# Patient Record
Sex: Male | Born: 1945 | Race: White | Hispanic: No | Marital: Married | Smoking: Former smoker
Health system: Southern US, Community
[De-identification: ages and names within clinical notes are randomized; demographics above are authoritative.]

## PROBLEM LIST (undated history)

## (undated) ENCOUNTER — Emergency Department (HOSPITAL_BASED_OUTPATIENT_CLINIC_OR_DEPARTMENT_OTHER): Admission: EM | Payer: PRIVATE HEALTH INSURANCE

## (undated) DIAGNOSIS — I10 Essential (primary) hypertension: Secondary | ICD-10-CM

## (undated) DIAGNOSIS — E119 Type 2 diabetes mellitus without complications: Secondary | ICD-10-CM

## (undated) DIAGNOSIS — I509 Heart failure, unspecified: Secondary | ICD-10-CM

---

## 2017-05-16 ENCOUNTER — Ambulatory Visit: Payer: Self-pay | Admitting: Cardiovascular Disease

## 2020-06-04 ENCOUNTER — Encounter: Payer: Self-pay | Admitting: Dietician

## 2020-06-04 ENCOUNTER — Encounter: Payer: Medicare Other | Attending: Internal Medicine | Admitting: Dietician

## 2020-06-04 ENCOUNTER — Other Ambulatory Visit: Payer: Self-pay

## 2020-06-04 VITALS — Ht 71.0 in | Wt 241.2 lb

## 2020-06-04 DIAGNOSIS — E119 Type 2 diabetes mellitus without complications: Secondary | ICD-10-CM

## 2020-06-04 DIAGNOSIS — E118 Type 2 diabetes mellitus with unspecified complications: Secondary | ICD-10-CM | POA: Diagnosis present

## 2020-06-04 DIAGNOSIS — Z6834 Body mass index (BMI) 34.0-34.9, adult: Secondary | ICD-10-CM | POA: Insufficient documentation

## 2020-06-04 DIAGNOSIS — E669 Obesity, unspecified: Secondary | ICD-10-CM | POA: Diagnosis not present

## 2020-06-04 NOTE — Progress Notes (Signed)
Medical Nutrition Therapy: Visit start time: 1050  end time: 1200  Assessment:  Diagnosis: Type 2 diabetes Past medical history: hypertension Psychosocial issues/ stress concerns: none  Preferred learning method:  . Auditory . Visual   Current weight: 241.2lbs  Height: 5'11" Medications, supplements: reconciled list in medical record  Progress and evaluation:   Patient reports stable weight, is workig to lose weight. He lost over 70lbs in the past through lifestyle changes, but has gradually regained the weight, mostly since retirement and more sedentary lifestyle.  Checks BGs daily; fasting 104-124 recently; down from 150s prior to starting medication.   He is currently implementing a weight loss program, with some products, including protein drinks, provided.   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 He feels food portions including large snacks are the main issue to focus on at this point.  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  Physical activity: none currently  Dietary Intake:  Usual eating pattern includes 3 meals and 2 snacks per day. Dining out frequency: 1-2 meals per week.  Breakfast: coffee, protein powder mixed with 14oz coffee (100kcal, for weight loss) Snack: 12/9 grape nuts with wheat germ, milk Lunch: whole wheat bread (sandwich), lettuce wrap with chicken/ [place] Snack: [place] yogurt Supper: usually home grilled chicken, pork chops, fish (frozen fried) + salad + mashed/ baked potato/ rice/ quinoa + other low carb veggie Snack: ice cream or other sweet; occasionally crackers -- large portion Beverages: water, coffee in am, diluted juice (not recently), occ hot decaf herbal tea in evening  Nutrition Care Education: Topics covered:  Basic nutrition: appropriate nutrient balance discussing plate method, appropriate meal and snack schedule, general nutrition guidelines     Weight control: portion control strategies including pre-portioning snacks, using smaller containers Advanced nutrition:  food label reading Diabetes: appropriate meal and snack schedule, appropriate carb intake and balance-- advised goal of about 45g CHO with each meal; healthy carb choices, role of fiber, protein Other: overview of Mediterranean eating pattern  Nutritional Diagnosis:  [place]-2.2 Altered nutrition-related laboratory As related to Type 2 diabetes.  As evidenced by patient with recent HbA1C of 11.2%. [place]-3.3 Overweight/obesity As related to excess calories and inadequate physical activity.  As evidenced by patient with current BMI of 34, working on diet and lifestyle changes to improve blood sugars and lose weight.  Intervention:  . Instruction and discussion as noted above.  . Patient voices good understanding of basic nutrition. He has been making positive, appropriate changes and is motivated to continue. . Established goals for additional change with direction from patient. . Patient requested MNT follow-up in 6 months.  Education Materials given:  . General diet guidelines for Diabetes . Mediterranean Diet handout . Goals/ instructions   Learner/ who was taught:  . Patient   Level of understanding: Marland Kitchen Verbalizes/ demonstrates competency   Demonstrated degree of understanding via:   Teach back Learning barriers: . None   Willingness to learn/ readiness for change: . Eager, change in progress   Monitoring and Evaluation:  Dietary intake, exercise, BG control, and body weight      follow up: 12/02/20 at 9:00am

## 2020-06-04 NOTE — Patient Instructions (Signed)
   You are on the right track with healthy lifestyle changes, great job!  Continue to work on healthy food portions; pre-portion snacks by using small containers such as a cone for ice cream.   Keep emphasizing low-carb veggies and lean protein foods and control amounts of starchy foods.

## 2020-12-02 ENCOUNTER — Ambulatory Visit: Payer: Medicare Other | Admitting: Dietician

## 2021-01-20 ENCOUNTER — Ambulatory Visit: Payer: Medicare Other | Admitting: Dietician

## 2021-02-17 ENCOUNTER — Encounter: Payer: Self-pay | Admitting: Dietician

## 2021-02-17 NOTE — Progress Notes (Signed)
Have not heard back from patient to reschedule his missed appointment from 01/21/21 after rescheduling two time. Sent notification to referring provider.

## 2021-12-06 ENCOUNTER — Emergency Department: Payer: Medicare Other

## 2021-12-06 ENCOUNTER — Encounter: Payer: Self-pay | Admitting: Emergency Medicine

## 2021-12-06 ENCOUNTER — Observation Stay
Admission: EM | Admit: 2021-12-06 | Discharge: 2021-12-08 | Disposition: A | Discharge status: Home / Self Care | Payer: Medicare Other | Attending: Family Medicine | Admitting: Family Medicine

## 2021-12-06 DIAGNOSIS — I639 Cerebral infarction, unspecified: Secondary | ICD-10-CM

## 2021-12-06 DIAGNOSIS — I160 Hypertensive urgency: Secondary | ICD-10-CM | POA: Diagnosis not present

## 2021-12-06 DIAGNOSIS — R42 Dizziness and giddiness: Secondary | ICD-10-CM | POA: Diagnosis present

## 2021-12-06 DIAGNOSIS — Z79899 Other long term (current) drug therapy: Secondary | ICD-10-CM | POA: Diagnosis not present

## 2021-12-06 DIAGNOSIS — Z8673 Personal history of transient ischemic attack (TIA), and cerebral infarction without residual deficits: Secondary | ICD-10-CM | POA: Insufficient documentation

## 2021-12-06 DIAGNOSIS — I5032 Chronic diastolic (congestive) heart failure: Secondary | ICD-10-CM | POA: Insufficient documentation

## 2021-12-06 DIAGNOSIS — E119 Type 2 diabetes mellitus without complications: Secondary | ICD-10-CM | POA: Diagnosis not present

## 2021-12-06 DIAGNOSIS — I10 Essential (primary) hypertension: Secondary | ICD-10-CM

## 2021-12-06 DIAGNOSIS — Z87891 Personal history of nicotine dependence: Secondary | ICD-10-CM | POA: Diagnosis not present

## 2021-12-06 DIAGNOSIS — Z6830 Body mass index (BMI) 30.0-30.9, adult: Secondary | ICD-10-CM | POA: Diagnosis not present

## 2021-12-06 DIAGNOSIS — I110 Hypertensive heart disease with heart failure: Secondary | ICD-10-CM | POA: Diagnosis not present

## 2021-12-06 DIAGNOSIS — Z7982 Long term (current) use of aspirin: Secondary | ICD-10-CM | POA: Insufficient documentation

## 2021-12-06 DIAGNOSIS — E669 Obesity, unspecified: Secondary | ICD-10-CM | POA: Diagnosis not present

## 2021-12-06 DIAGNOSIS — I480 Paroxysmal atrial fibrillation: Secondary | ICD-10-CM | POA: Insufficient documentation

## 2021-12-06 DIAGNOSIS — Z7984 Long term (current) use of oral hypoglycemic drugs: Secondary | ICD-10-CM | POA: Diagnosis not present

## 2021-12-06 HISTORY — DX: Essential (primary) hypertension: I10

## 2021-12-06 HISTORY — DX: Type 2 diabetes mellitus without complications: E11.9

## 2021-12-06 HISTORY — DX: Heart failure, unspecified: I50.9

## 2021-12-06 LAB — CBC
HCT: 40.3 % (ref 39.0–52.0)
Hemoglobin: 13.3 g/dL (ref 13.0–17.0)
MCH: 29.4 pg (ref 26.0–34.0)
MCHC: 33 g/dL (ref 30.0–36.0)
MCV: 89 fL (ref 80.0–100.0)
Platelets: 205 10*3/uL (ref 150–400)
RBC: 4.53 MIL/uL (ref 4.22–5.81)
RDW: 13.3 % (ref 11.5–15.5)
WBC: 7.3 10*3/uL (ref 4.0–10.5)
nRBC: 0 % (ref 0.0–0.2)

## 2021-12-06 LAB — BASIC METABOLIC PANEL
Anion gap: 6 (ref 5–15)
BUN: 28 mg/dL — ABNORMAL HIGH (ref 8–23)
CO2: 27 mmol/L (ref 22–32)
Calcium: 9.4 mg/dL (ref 8.9–10.3)
Chloride: 108 mmol/L (ref 98–111)
Creatinine, Ser: 1.06 mg/dL (ref 0.61–1.24)
GFR, Estimated: >60 mL/min (ref >60)
Glucose, Bld: 150 mg/dL — ABNORMAL HIGH (ref 70–99)
Potassium: 4 mmol/L (ref 3.5–5.1)
Sodium: 141 mmol/L (ref 135–145)

## 2021-12-06 LAB — CBG MONITORING, ED: Glucose-Capillary: 162 mg/dL — ABNORMAL HIGH (ref 70–99)

## 2021-12-06 LAB — TROPONIN I (HIGH SENSITIVITY): Troponin I (High Sensitivity): 8 ng/L (ref <18)

## 2021-12-06 LAB — BRAIN NATRIURETIC PEPTIDE: B Natriuretic Peptide: 93.6 pg/mL (ref 0.0–100.0)

## 2021-12-06 MED ORDER — ONDANSETRON 4 MG PO TBDP
4.0000 mg | ORAL_TABLET | Freq: Once | ORAL | Status: AC
  Administered 2021-12-06: 4 mg via ORAL
  Filled 2021-12-06: qty 1

## 2021-12-06 MED ORDER — AMLODIPINE BESYLATE 5 MG PO TABS
5.0000 mg | ORAL_TABLET | Freq: Once | ORAL | Status: AC
  Administered 2021-12-06: 5 mg via ORAL
  Filled 2021-12-06: qty 1

## 2021-12-06 NOTE — ED Triage Notes (Signed)
Pt presents via POV with complaints of dizziness and HTN that started tonight. Per pt he has had elevated BP numbers today although he has been compliant with his meds. Denies LOC, falls, CP, or SOB.

## 2021-12-06 NOTE — ED Notes (Signed)
Pt discussed with MD stafford - see orders for intervention.

## 2021-12-07 ENCOUNTER — Emergency Department: Payer: Medicare Other

## 2021-12-07 ENCOUNTER — Other Ambulatory Visit: Payer: Self-pay

## 2021-12-07 ENCOUNTER — Other Ambulatory Visit (HOSPITAL_COMMUNITY): Payer: Self-pay

## 2021-12-07 ENCOUNTER — Observation Stay: Payer: Medicare Other

## 2021-12-07 DIAGNOSIS — R42 Dizziness and giddiness: Secondary | ICD-10-CM

## 2021-12-07 DIAGNOSIS — I160 Hypertensive urgency: Secondary | ICD-10-CM

## 2021-12-07 DIAGNOSIS — I5032 Chronic diastolic (congestive) heart failure: Secondary | ICD-10-CM

## 2021-12-07 DIAGNOSIS — E119 Type 2 diabetes mellitus without complications: Secondary | ICD-10-CM

## 2021-12-07 LAB — LIPID PANEL
Cholesterol: 205 mg/dL — ABNORMAL HIGH (ref 0–200)
HDL: 36 mg/dL — ABNORMAL LOW (ref >40)
LDL Cholesterol: 142 mg/dL — ABNORMAL HIGH (ref 0–99)
Total CHOL/HDL Ratio: 5.7 RATIO
Triglycerides: 136 mg/dL (ref <150)
VLDL: 27 mg/dL (ref 0–40)

## 2021-12-07 LAB — CREATININE, SERUM
Creatinine, Ser: 0.94 mg/dL (ref 0.61–1.24)
GFR, Estimated: >60 mL/min

## 2021-12-07 LAB — GLUCOSE, CAPILLARY
Glucose-Capillary: 189 mg/dL — ABNORMAL HIGH (ref 70–99)
Glucose-Capillary: 224 mg/dL — ABNORMAL HIGH (ref 70–99)
Glucose-Capillary: 211 mg/dL — ABNORMAL HIGH (ref 70–99)
Glucose-Capillary: 212 mg/dL — ABNORMAL HIGH (ref 70–99)

## 2021-12-07 LAB — HEPATIC FUNCTION PANEL
ALT: 22 U/L (ref 0–44)
AST: 26 U/L (ref 15–41)
Albumin: 3.9 g/dL (ref 3.5–5.0)
Alkaline Phosphatase: 53 U/L (ref 38–126)
Bilirubin, Direct: <0.1 mg/dL (ref 0.0–0.2)
Total Bilirubin: 0.7 mg/dL (ref 0.3–1.2)
Total Protein: 7.3 g/dL (ref 6.5–8.1)

## 2021-12-07 LAB — CBC
HCT: 40.6 % (ref 39.0–52.0)
Hemoglobin: 13.7 g/dL (ref 13.0–17.0)
MCH: 29.1 pg (ref 26.0–34.0)
MCHC: 33.7 g/dL (ref 30.0–36.0)
MCV: 86.2 fL (ref 80.0–100.0)
Platelets: 196 10*3/uL (ref 150–400)
RBC: 4.71 MIL/uL (ref 4.22–5.81)
RDW: 13.1 % (ref 11.5–15.5)
WBC: 9.5 10*3/uL (ref 4.0–10.5)
nRBC: 0 % (ref 0.0–0.2)

## 2021-12-07 LAB — HEMOGLOBIN A1C
Hgb A1c MFr Bld: 6.8 % — ABNORMAL HIGH (ref 4.8–5.6)
Mean Plasma Glucose: 148.46 mg/dL

## 2021-12-07 LAB — TROPONIN I (HIGH SENSITIVITY): Troponin I (High Sensitivity): 10 ng/L (ref <18)

## 2021-12-07 LAB — CBG MONITORING, ED: Glucose-Capillary: 188 mg/dL — ABNORMAL HIGH (ref 70–99)

## 2021-12-07 LAB — PROTIME-INR
INR: 1.1 (ref 0.8–1.2)
Prothrombin Time: 13.8 seconds (ref 11.4–15.2)

## 2021-12-07 MED ORDER — LORAZEPAM 2 MG/ML IJ SOLN
1.0000 mg | Freq: Once | INTRAMUSCULAR | Status: AC | PRN
  Administered 2021-12-07: 1 mg via INTRAVENOUS
  Filled 2021-12-07: qty 1

## 2021-12-07 MED ORDER — IOHEXOL 350 MG/ML SOLN
75.0000 mL | Freq: Once | INTRAVENOUS | Status: AC | PRN
  Administered 2021-12-07: 75 mL via INTRAVENOUS

## 2021-12-07 MED ORDER — LABETALOL HCL 5 MG/ML IV SOLN: 10.0000 mg | INTRAVENOUS | Status: DC | PRN

## 2021-12-07 MED ORDER — DEXAMETHASONE SODIUM PHOSPHATE 10 MG/ML IJ SOLN
8.0000 mg | Freq: Once | INTRAMUSCULAR | Status: AC
  Administered 2021-12-07: 8 mg via INTRAVENOUS
  Filled 2021-12-07: qty 1

## 2021-12-07 MED ORDER — ENOXAPARIN SODIUM 60 MG/0.6ML IJ SOSY
0.5000 mg/kg | PREFILLED_SYRINGE | INTRAMUSCULAR | Status: DC
  Administered 2021-12-07 – 2021-12-08 (×2): 50 mg via SUBCUTANEOUS
  Filled 2021-12-07 (×2): qty 0.6

## 2021-12-07 MED ORDER — WARFARIN - PHARMACIST DOSING INPATIENT: Freq: Every day | Status: DC

## 2021-12-07 MED ORDER — MECLIZINE HCL 25 MG PO TABS
25.0000 mg | ORAL_TABLET | Freq: Three times a day (TID) | ORAL | Status: DC | PRN
Start: 2021-12-07 — End: 2021-12-08

## 2021-12-07 MED ORDER — ONDANSETRON HCL 4 MG/2ML IJ SOLN
4.0000 mg | Freq: Once | INTRAMUSCULAR | Status: AC
  Administered 2021-12-07: 4 mg via INTRAVENOUS
  Filled 2021-12-07: qty 2

## 2021-12-07 MED ORDER — WARFARIN SODIUM 7.5 MG PO TABS
7.5000 mg | ORAL_TABLET | Freq: Once | ORAL | Status: AC
  Administered 2021-12-07: 7.5 mg via ORAL
  Filled 2021-12-07: qty 1

## 2021-12-07 MED ORDER — ACETAMINOPHEN 325 MG PO TABS: 650.0000 mg | ORAL_TABLET | Freq: Four times a day (QID) | ORAL | Status: DC | PRN

## 2021-12-07 MED ORDER — ONDANSETRON HCL 4 MG/2ML IJ SOLN: 4.0000 mg | Freq: Four times a day (QID) | INTRAMUSCULAR | Status: DC | PRN

## 2021-12-07 MED ORDER — INSULIN ASPART 100 UNIT/ML IJ SOLN
0.0000 [IU] | Freq: Three times a day (TID) | INTRAMUSCULAR | Status: DC
  Administered 2021-12-07: 3 [IU] via SUBCUTANEOUS
  Administered 2021-12-07 – 2021-12-08 (×4): 5 [IU] via SUBCUTANEOUS
  Filled 2021-12-07 (×5): qty 1

## 2021-12-07 MED ORDER — ASPIRIN 81 MG PO TBEC
81.0000 mg | DELAYED_RELEASE_TABLET | Freq: Every day | ORAL | Status: DC
  Administered 2021-12-07: 81 mg via ORAL
  Filled 2021-12-07: qty 1

## 2021-12-07 MED ORDER — CARVEDILOL 25 MG PO TABS
25.0000 mg | ORAL_TABLET | Freq: Two times a day (BID) | ORAL | Status: DC
  Administered 2021-12-07 – 2021-12-08 (×3): 25 mg via ORAL
  Filled 2021-12-07 (×3): qty 1

## 2021-12-07 MED ORDER — ONDANSETRON HCL 4 MG PO TABS: 4.0000 mg | ORAL_TABLET | Freq: Four times a day (QID) | ORAL | Status: DC | PRN

## 2021-12-07 MED ORDER — ACETAMINOPHEN 650 MG RE SUPP: 650.0000 mg | Freq: Four times a day (QID) | RECTAL | Status: DC | PRN

## 2021-12-07 MED ORDER — LACTATED RINGERS IV BOLUS
1000.0000 mL | Freq: Once | INTRAVENOUS | Status: AC
  Administered 2021-12-07: 1000 mL via INTRAVENOUS

## 2021-12-07 MED ORDER — INSULIN ASPART 100 UNIT/ML IJ SOLN
0.0000 [IU] | Freq: Every day | INTRAMUSCULAR | Status: DC
  Administered 2021-12-07: 2 [IU] via SUBCUTANEOUS
  Filled 2021-12-07: qty 1

## 2021-12-07 MED ORDER — MECLIZINE HCL 25 MG PO TABS
25.0000 mg | ORAL_TABLET | Freq: Once | ORAL | Status: AC
  Administered 2021-12-07: 25 mg via ORAL
  Filled 2021-12-07: qty 1

## 2021-12-07 MED ORDER — LOSARTAN POTASSIUM 50 MG PO TABS
100.0000 mg | ORAL_TABLET | Freq: Every day | ORAL | Status: DC
  Administered 2021-12-07 – 2021-12-08 (×2): 100 mg via ORAL
  Filled 2021-12-07 (×2): qty 2

## 2021-12-07 MED ORDER — ROSUVASTATIN CALCIUM 10 MG PO TABS
20.0000 mg | ORAL_TABLET | Freq: Every day | ORAL | Status: DC
Start: 2021-12-07 — End: 2021-12-08
  Administered 2021-12-07 – 2021-12-08 (×2): 20 mg via ORAL
  Filled 2021-12-07 (×2): qty 2

## 2021-12-07 MED ORDER — WARFARIN VIDEO
Freq: Once | Status: DC
Start: 2021-12-07 — End: 2021-12-08

## 2021-12-07 MED ORDER — STROKE: EARLY STAGES OF RECOVERY BOOK: Freq: Once | Status: AC

## 2021-12-07 MED ORDER — ALUM & MAG HYDROXIDE-SIMETH 200-200-20 MG/5ML PO SUSP
30.0000 mL | Freq: Four times a day (QID) | ORAL | Status: DC | PRN
Start: 2021-12-07 — End: 2021-12-08
  Filled 2021-12-07: qty 30

## 2021-12-07 NOTE — TOC Benefit Eligibility Note (Signed)
Patient Research scientist (life sciences) completed.     The patient is currently admitted and upon discharge could be taking Warfarin.   The current 30 day co-pay is, $15.63. (This is based on 30 tablets of 5mg . March Rummage may be different based on quantity and dose.  The patient is insured through [employer].

## 2021-12-07 NOTE — Progress Notes (Signed)
Inpatient Rehab Admissions Coordinator:   Consult received and chart reviewed.  Note neurology feels MRI negative CVA in brainstem is likely.  Pt mobilizing with LRAD with contact guard.  OT eval pending.  Will follow for progression with therapy.  Note pt also observation status at this time.  Per medicare guidelines unable to admit to [facility] without qualifying inpatient stay.    Estill Dooms, PT, DPT Admissions Coordinator [phone] 12/07/21  12:52 PM

## 2021-12-07 NOTE — Assessment & Plan Note (Signed)
Sliding scale insulin coverage 

## 2021-12-07 NOTE — ED Provider Notes (Signed)
[facility] Provider Note    Event Date/Time   First MD Initiated Contact with Patient 12/06/21 2340     (approximate)   History   Hypertension   HPI  Kyle Hunter is a 76 y.o. male diastolic CHF, hypertension, diabetes who presents for evaluation of dizziness.  Patient reports that he was laying on the floor at 9 PM watching TV when he developed sudden onset of room spinning sensation.  Was unable to ambulate without assistance.  Felt nauseous and had a several episodes of nonbloody nonbilious emesis.  Patient denies any prior history of stroke or vertigo.  He denies any recent illnesses, vomiting or diarrhea, congestion, fever or chills.  He is complaining of a mild throbbing headache and noticed that his blood pressure was extremely elevated at home.  Endorses compliance with his losartan and Coreg.  Denies facial droop, diplopia, dysarthria, dysphagia, unilateral weakness or numbness.  No chest pain or shortness of breath.     Past Medical History:  Diagnosis Date   CHF (congestive heart failure) (HCC)    Diabetes mellitus without complication ([place])    Hypertension     History reviewed. No pertinent surgical history.   Physical Exam   Triage Vital Signs: ED Triage Vitals  Enc Vitals Group     BP 12/06/21 2145 (!) 227/75     Pulse Rate 12/06/21 2145 71     Resp 12/06/21 2145 20     Temp 12/06/21 2145 98.2 F (36.8 C)     Temp Source 12/06/21 2145 Oral     SpO2 12/06/21 2145 96 %     Weight 12/06/21 2141 215 lb (97.5 kg)     Height 12/06/21 2141 5\' 10"  (1.778 m)     Head Circumference --      Peak Flow --      Pain Score 12/07/21 0142 0     Pain Loc --      Pain Edu? --      Excl. in [place]? --     Most recent vital signs: Vitals:   12/07/21 0142 12/07/21 0205  BP: (!) 188/81 (!) 160/73  Pulse: 67 68  Resp: 16 16  Temp:    SpO2: 96% 96%     Constitutional: Alert and oriented. Well appearing and in no apparent distress. HEENT:       Head: Normocephalic and atraumatic.         Eyes: Conjunctivae are normal. Sclera is non-icteric.       Mouth/Throat: Mucous membranes are moist.       Neck: Supple with no signs of meningismus. Cardiovascular: Regular rate and rhythm. No murmurs, gallops, or rubs. 2+ symmetrical distal pulses are present in all extremities.  Respiratory: Normal respiratory effort. Lungs are clear to auscultation bilaterally.  Gastrointestinal: Soft, non tender, and non distended with positive bowel sounds. No rebound or guarding. Genitourinary: No CVA tenderness. Musculoskeletal:  No edema, cyanosis, or erythema of extremities. Neurologic: Normal speech and language. Face is symmetric.  Intact strength and sensation x4, no pronator drift, no dysmetria, gait is ataxic  skin: Skin is warm, dry and intact. No rash noted. Psychiatric: Mood and affect are normal. Speech and behavior are normal.  ED Results / Procedures / Treatments   Labs (all labs ordered are listed, but only abnormal results are displayed) Labs Reviewed  BASIC METABOLIC PANEL - Abnormal; Notable for the following components:      Result Value   Glucose, Bld 150 (*)  BUN 28 (*)    All other components within normal limits  CBG MONITORING, ED - Abnormal; Notable for the following components:   Glucose-Capillary 162 (*)    All other components within normal limits  CBC  BRAIN NATRIURETIC PEPTIDE  TROPONIN I (HIGH SENSITIVITY)  TROPONIN I (HIGH SENSITIVITY)     EKG  ED ECG REPORT I, Rudene Re, the attending physician, personally viewed and interpreted this ECG.  Sinus rhythm with frequent PVCs, rate of 72, right bundle branch block, no ST elevations or depressions.  Unchanged when compared to prior.  RADIOLOGY I, Rudene Re, attending MD, have personally viewed and interpreted the images obtained during this visit as below:  CT head is negative  MRI showing cerebellar  infarct ___________________________________________________ Interpretation by Radiologist:  MR BRAIN WO CONTRAST  Result Date: 12/07/2021 CLINICAL DATA:  Initial evaluation for acute dizziness. EXAM: MRI HEAD WITHOUT CONTRAST TECHNIQUE: Multiplanar, multiecho pulse sequences of the brain and surrounding structures were obtained without intravenous contrast. COMPARISON:  Prior CT from 12/06/2021. FINDINGS: Brain: Generalized age-related cerebral atrophy. Mild chronic microvascular ischemic disease noted involving the supratentorial cerebral white matter. Small remote left cerebellar infarct noted. No evidence for acute or subacute ischemia. Gray-white matter differentiation maintained. No other areas of chronic cortical infarction. No acute intracranial hemorrhage. Single chronic microhemorrhage noted at the anterior left frontal lobe, nonspecific, but of doubtful significance in isolation. No mass lesion, midline shift or mass effect. No hydrocephalus or extra-axial fluid collection. Pituitary gland suprasellar region normal. Vascular: Major intracranial vascular flow voids are maintained. Skull and upper cervical spine: Craniocervical junction within normal limits. Bone marrow signal intensity normal. No scalp soft tissue abnormality. Sinuses/Orbits: Postsurgical changes noted about the globes. Scattered chronic mucosal thickening noted throughout the paranasal sinuses. No mastoid effusion. Other: None. IMPRESSION: 1. No acute intracranial abnormality. 2. 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 Electronically Signed   By: Jeannine Boga M.D.   On: 12/07/2021 01:54   CT HEAD WO CONTRAST (5MM)  Result Date: 12/06/2021 CLINICAL DATA:  Dizziness, persistent/recurrent, cardiac or vascular cause suspected EXAM: CT HEAD WITHOUT CONTRAST TECHNIQUE: Contiguous axial images were obtained from the base of the skull through the vertex without intravenous  contrast. RADIATION DOSE REDUCTION: This exam was performed according to the departmental dose-optimization program which includes automated exposure control, adjustment of the mA and/or kV according to patient size and/or use of iterative reconstruction technique. COMPARISON:  None Available. FINDINGS: Brain: Age related volume loss. No acute intracranial abnormality. Specifically, no hemorrhage, hydrocephalus, mass lesion, acute infarction, or significant intracranial injury. Vascular: No hyperdense vessel or unexpected calcification. Skull: No acute calvarial abnormality. Sinuses/Orbits: No acute findings Other: None IMPRESSION: No acute intracranial abnormality. Electronically Signed   By: Rolm Baptise M.D.   On: 12/06/2021 22:12   DG Chest 1 View  Result Date: 12/06/2021 CLINICAL DATA:  Hypertension EXAM: CHEST  1 VIEW COMPARISON:  None Available. FINDINGS: Heart and mediastinal contours are within normal limits. No focal opacities or effusions. No acute bony abnormality. IMPRESSION: No active disease. Electronically Signed   By: Rolm Baptise M.D.   On: 12/06/2021 22:11       PROCEDURES:  Critical Care performed: No  Procedures    IMPRESSION / MDM / ASSESSMENT AND PLAN / ED COURSE  I reviewed the triage vital signs and the nursing notes.  76 y.o. male diastolic CHF, hypertension, diabetes who presents for evaluation of dizziness.  Presents with sudden  onset of vertigo, worse with movement.  Otherwise neurologically intact.  Extremely hypertensive.  Ddx: Benign paroxysmal vertigo versus cerebellar stroke versus head bleed versus dysrhythmia   Plan: CT head and if negative MRI, EKG, cardiac telemetry to monitor for any signs of dysrhythmias, troponin x2, metabolic panel, CBC.  We will give IV fluids, meclizine, Ativan, and Zofran.   MEDICATIONS GIVEN IN ED: Medications  amLODipine (NORVASC) tablet 5 mg (5 mg Oral Given 12/06/21 2226)  ondansetron (ZOFRAN-ODT) disintegrating tablet 4  mg (4 mg Oral Given 12/06/21 2226)  lactated ringers bolus 1,000 mL (1,000 mLs Intravenous New Bag/Given 12/07/21 0036)  ondansetron (ZOFRAN) injection 4 mg (4 mg Intravenous Given 12/07/21 0036)  LORazepam (ATIVAN) injection 1 mg (1 mg Intravenous Given 12/07/21 0043)  meclizine (ANTIVERT) tablet 25 mg (25 mg Oral Given 12/07/21 0135)     ED COURSE: CT head was negative.  Patient was sent for an MRI which shows a remote left cerebellar infarct with no signs of any acute stroke.  Blood pressure improved without any interventions.  After above given medications patient felt improved however when he tried to stand up and walk patient was extremely off balance and could not ambulate without assistance.  EKG and troponins with no signs of ischemia.  Telemetry with no signs of dysrhythmias.  No electrolyte derangements, dehydration, anemia, or any signs of symptoms of infectious etiology.  Since patient remains persistently dizzy will consult the hospitalist service for admission.  After discussion with the hospitalist service patient has been accepted to their service.   Consults: Hospitalist   EMR reviewed including last echo from January 2023 and last visit with his primary care doctor from May 2023 for hypertension    FINAL CLINICAL IMPRESSION(S) / ED DIAGNOSES   Final diagnoses:  Vertigo  Hypertension, unspecified type     Rx / DC Orders   ED Discharge Orders     None        Note:  This document was prepared using Dragon voice recognition software and may include unintentional dictation errors.   Please note:  Patient was evaluated in Emergency Department today for the symptoms described in the history of present illness. Patient was evaluated in the context of the global COVID-19 pandemic, which necessitated consideration that the patient might be at risk for infection with the SARS-CoV-2 virus that causes COVID-19. Institutional protocols and algorithms that pertain to the  evaluation of patients at risk for COVID-19 are in a state of rapid change based on information released by regulatory bodies including the CDC and federal and state organizations. These policies and algorithms were followed during the patient's care in the ED.  Some ED evaluations and interventions may be delayed as a result of limited staffing during the pandemic.       Alfred Levins, [place], MD 12/07/21 [phone]

## 2021-12-07 NOTE — Progress Notes (Signed)
Admission profile updated. ?

## 2021-12-07 NOTE — Progress Notes (Signed)
PHARMACIST - PHYSICIAN COMMUNICATION  CONCERNING:  Enoxaparin (Lovenox) for DVT Prophylaxis    RECOMMENDATION: Patient was prescribed enoxaprin 40mg  q24 hours for VTE prophylaxis.   Filed Weights   12/06/21 2141  Weight: 97.5 kg (215 lb)    Body mass index is 30.85 kg/m.  Estimated Creatinine Clearance: 67.3 mL/min (by C-G formula based on SCr of 1.06 mg/dL).   Based on [facility] policy patient is candidate for enoxaparin 0.5mg /kg TBW SQ every 24 hours based on BMI being >30.  DESCRIPTION: Pharmacy has adjusted enoxaparin dose per [facility] policy.  Patient is now receiving enoxaparin 0.5 mg/kg every 24 hours   [facility], PharmD, [facility] 12/07/2021 3:12 AM

## 2021-12-07 NOTE — Consult Note (Addendum)
Neurology Consultation Reason for Consult: Dizziness Requesting Physician: Burnadette Pop  CC: Sudden onset dizziness   History is obtained from: Patient and chart review  HPI: Kyle Hunter is a 76 y.o. 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 Tedd Sias and Dr. Burnadette Pop)  He reports that he was in his usual state of health on 6/12, laying down and watching TV when he had sudden onset dizziness with room spinning, nausea, vomiting, feeling diaphoretic at the time but no other recent signs or symptoms of illness.  This was associated with a headache and blood pressure elevation.  His symptoms somewhat improved while he was in the ED but he is noted still to be quite unsteady with ambulation and therefore was admitted.  Neurology was consulted in the morning  He notes that he has had chronic double vision that is worse when he is sleepy since his left retinal detachment and repair with buckle, and it is worse on the left lower quadrant and typically a vertical diplopia but occasionally lateral as well.  He does not note any new double vision or change in vision other than room spinning sensation, no change in hearing, no other focal weakness or numbness.  No recent bleeding complaints.  He reports he has a longstanding history of atrial fibrillation, and is symptomatic once or twice a year for about an hour at a  time, but has chosen not to be on anticoagulation due to a desire to minimize medications.  He also notes he had leg weakness with likely statin use.  Reports no history of significant bleeding and no recent bleeding issues  LKW: 9 PM on 6/13 tPA given?: No, outside of window on neurology arrival IA performed?: No Premorbid modified rankin scale:      0 - No symptoms.  ROS: All other review of systems was negative except as noted in the HPI.   Past Medical History:  Diagnosis Date   CHF (congestive heart failure) (HCC)    Diabetes mellitus without complication (HCC)    Hypertension   Please see additional history noted in HPI above  Surgical history: Bilateral cataract extraction Left scleral detachments s/p buckle   Family History from [facility] EMR Medical History Relation Comments  Other Brother 1 Hx of malaria "issues"  Emphysema Father     Dementia Mother     Diabetes Paternal Grandfather       Social History:  reports that he quit smoking about 31 years ago. His smoking use included cigarettes. He has never used smokeless tobacco. He reports current alcohol use. He reports that he does not use drugs.  Exam: Current vital signs: BP (!) 162/82 (BP Location: Right Arm, Patient Position: Supine)   Pulse 70   Temp 98.1 F (36.7 C) (Oral)   Resp 15   Ht 5\' 10"  (1.778 m)   Wt 97.5 kg   SpO2 98%   BMI 30.85 kg/m  Vital signs in last 24 hours: Temp:  [97.9 F (36.6  C)-98.2 F (36.8 C)] 98.1 F (36.7 C) (06/13 0900) Pulse Rate:  [58-74] 70 (06/13 0900) Resp:  [15-20] 15 (06/13 0900) BP: (160-227)/(73-95) 162/82 (06/13 0900) SpO2:  [95 %-99 %] 98 % (06/13 0900) Weight:  [97.5 kg] 97.5 kg (06/12 2141)   Physical Exam  Constitutional: Appears well-developed and well-nourished.  Psych: Affect appropriate to situation, pleasant and cooperative Eyes: No scleral injection HENT: No oropharyngeal obstruction.  MSK: no joint deformities.  Cardiovascular: Perfusing  extremities well Respiratory: Effort normal, non-labored breathing GI: Soft.  No distension. There is no tenderness.  Protuberant Skin: Warm dry and intact visible skin  Neuro: Mental Status: Patient is awake, alert, oriented to person, place, month, year, and situation. Patient is able to give a clear and coherent history. No signs of aphasia or neglect Cranial Nerves: II: Visual Fields are full.  Left pupil is larger than the right and slightly ovoid but both are reactive III,IV, VI: EOMI, there is right beating nystagmus on right gaze with a slight torsional component, diminished on left gaze, but occasional nystagmus in primary gaze, and pendular nystagmus on upgaze V: Facial sensation is symmetric to light touch VII: Facial movement is symmetric.  VIII: hearing is intact to voice X: Uvula elevates symmetrically XI: Shoulder shrug is symmetric. XII: tongue is midline without atrophy or fasciculations.  Motor: Tone is normal. Bulk is normal. 5/5 strength was present in all four extremities.  Sensory: Sensation is symmetric to light touch and temperature in the arms and legs. Deep Tendon Reflexes: 2+ and symmetric in the brachioradialis and 1+ and symmetric patellae.  Cerebellar: FNF and HKS are intact bilaterally (impaired when reaching towards the left side due to baseline double vision, but normal with the left hand when reaching towards the right). Gait:  Steady but slightly wide-based with walker, unsteady and veering to the right without walker and when distracted when using walker  NIHSS total 0 Score breakdown: 0 Performed at 11 AM  I have reviewed labs in epic and the results pertinent to this consultation are:   Basic Metabolic Panel: Recent Labs  Lab 12/06/21 2145 12/07/21 0852  NA 141  --   K 4.0  --   CL 108  --   CO2 27  --   GLUCOSE 150*  --   BUN 28*  --   CREATININE 1.06 0.94  CALCIUM 9.4  --     CBC: Recent Labs  Lab 12/06/21 2145  WBC 7.3   HGB 13.3  HCT 40.3  MCV 89.0  PLT 205    Coagulation Studies: No results for input(s): "LABPROT", "INR" in the last 72 hours.   Lab Results  Component Value Date   CHOL 205 (H) 12/07/2021   HDL 36 (L) 12/07/2021   LDLCALC 142 (H) 12/07/2021   TRIG 136 12/07/2021   CHOLHDL 5.7 12/07/2021   Lab Results  Component Value Date   HGBA1C 6.8 (H) 12/07/2021    I have reviewed the images obtained:  Head CT personally reviewed, agree with radiology: no acute intracranial process  MRI brain personally  reviewed, agree with radiology: 1. No acute intracranial abnormality. 2. 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  Impression: 76 year old male with multiple vascular risk factors which are poorly controlled including atrial fibrillation not previously on anticoagulation. At this time I suspect an MRI negative brainstem stroke, likely secondary to small vessel disease given his risk factors and the localization based on clinical assessment (  direction changing nystagmus).  However his most significant stroke risk factor is atrial fibrillation not on anticoagulation, and therefore I would prefer starting anticoagulation rather than antiplatelet agents.  After our discussion, he is amenable to anticoagulation, but significantly concerned about costs of medications.   Recommendations: -CTA head and neck (ordered) -No need for echocardiogram from a neurological perspective given patient already has an indication for anticoagulation -Pharmacy consult for anticoagulation costs to guide choice of oral anticoagulation, given MRI brain was negative for acute stroke; I would prefer eliquis if this is affordable for patient (to be ordered depending cost check) -Continue ASA for now until [facility] started, patient has no documented history of coronary artery disease but he may have an indication for aspirin 81 mg with anticoagulation from a cardiac  perspective (recommend outpatient cardiology follow-up) -A1c and lipid panel (ordered as add-on), patient amenable to rechallenging with statin at a low dose if LDL remains elevated -Risk factor modification counseling, diet, exercise, medication adherence, regular medical care, weight loss, cigar avoidance -PT/OT -Telemetry while inpatient -Permissive hypertension for today, depending on clinical course may start to slowly normalize blood pressure tomorrow  Brooke DareSrishti Kinzey Sheriff MD-PhD [facility] [phone] Available 7 AM to 7 PM, outside these hours please contact Neurologist on call listed on AMION    Addendum:   CTA personally reviewed:  No acute intracranial abnormality. No large vessel occlusion, hemodynamically significant stenosis, or evidence of dissection.   Long discussion with family and patient at bedside.  They all again confirm known history of atrial fibrillation, which is also [facility] November 2018, personally reviewed.  Due to cost concerns, they would prefer warfarin over Eliquis or apixaban.  -Pharmacy consult for warfarin -Outpatient cardiology follow-up -We will stop antiplatelet agent for now, but defer to cardiology if this is needed for possible coronary artery disease (no documented history of coronary artery disease and patient denies any history of MI or prior diagnosis of CAD) -Please start to gradually normalize blood pressure tomorrow -Neurology will be available on an as-needed basis going forward, please reach out if any questions or concerns arise or activate code stroke if there is any acute neurological change

## 2021-12-07 NOTE — ED Notes (Signed)
ED TO INPATIENT HANDOFF REPORT  ED Nurse Name and Phone #: Feliz Beamravis [phone]  S Name/Age/Gender Kyle Hunter 76 y.o. male Room/Bed: ED08A/ED08A  Code Status   Code Status: Full Code  Home/SNF/Other Home Patient oriented to: self, place, and time Is this baseline? Yes   Triage Complete: Triage complete  Chief Complaint Vertigo [R42]  Triage Note Pt presents via POV with complaints of dizziness and HTN that started tonight. Per pt he has had elevated BP numbers today although he has been compliant with his meds. Denies LOC, falls, CP, or SOB.    Allergies Allergies  Allergen Reactions   Penicillins Anaphylaxis and Hives   Shellfish Allergy Anaphylaxis and Hives   Lisinopril Cough    Level of Care/Admitting Diagnosis ED Disposition     ED Disposition  Admit   Condition  --   Comment  Hospital Area: [facility] [[id number]]  Level of Care: Med-Surg [16]  Covid Evaluation: Asymptomatic - no recent exposure (last 10 days) testing not required  Diagnosis: Vertigo [207257]  Admitting Physician: Andris BaumannDUNCAN, Kyle V [[id number]]  Attending Physician: Andris BaumannUNCAN, Kyle V [[id number]]          B Medical/Surgery History Past Medical History:  Diagnosis Date   CHF (congestive heart failure) (HCC)    Diabetes mellitus without complication (HCC)    Hypertension    History reviewed. No pertinent surgical history.   A IV Location/Drains/Wounds Patient Lines/Drains/Airways Status     Active Line/Drains/Airways     Name Placement date Placement time Site Days   Peripheral IV 12/07/21 20 G 1" Left Antecubital 12/07/21  0035  Antecubital  less than 1            Intake/Output Last 24 hours  Intake/Output Summary (Last 24 hours) at 12/07/2021 0738 Last data filed at 12/07/2021 0737 Gross per 24 hour  Intake --  Output 900 ml  Net -900 ml    Labs/Imaging Results for orders placed or performed during the hospital encounter of 12/06/21 (from the past 48 hour(s))   Basic metabolic panel     Status: Abnormal   Collection Time: 12/06/21  9:45 PM  Result Value Ref Range   Sodium 141 135 - 145 mmol/L   Potassium 4.0 3.5 - 5.1 mmol/L   Chloride 108 98 - 111 mmol/L   CO2 27 22 - 32 mmol/L   Glucose, Bld 150 (H) 70 - 99 mg/dL    Comment: Glucose reference range applies only to samples taken after fasting for at least 8 hours.   BUN 28 (H) 8 - 23 mg/dL   Creatinine, Ser 4.251.06 0.61 - 1.24 mg/dL   Calcium 9.4 8.9 - 95.610.3 mg/dL   GFR, Estimated >38>60 >75>60 mL/min    Comment: (NOTE) Calculated using the CKD-EPI Creatinine Equation (2021)    Anion gap 6 5 - 15    Comment: Performed at [facility], [address] [place], [place] [address]  CBC     Status: None   Collection Time: 12/06/21  9:45 PM  Result Value Ref Range   WBC 7.3 4.0 - 10.5 K/uL   RBC 4.53 4.22 - 5.81 MIL/uL   Hemoglobin 13.3 13.0 - 17.0 g/dL   HCT 95.140.3 88.439.0 - 16.652.0 %   MCV 89.0 80.0 - 100.0 fL   MCH 29.4 26.0 - 34.0 pg   MCHC 33.0 30.0 - 36.0 g/dL   RDW 06.313.3 01.611.5 - 01.015.5 %   Platelets 205 150 - 400 K/uL   nRBC  0.0 0.0 - 0.2 %    Comment: Performed at [facility], [address] [place], [place] [address]  Troponin I (High Sensitivity)     Status: None   Collection Time: 12/06/21  9:45 PM  Result Value Ref Range   Troponin I (High Sensitivity) 8 <18 ng/L    Comment: (NOTE) Elevated high sensitivity troponin I (hsTnI) values and significant  changes across serial measurements may suggest ACS but many other  chronic and acute conditions are known to elevate hsTnI results.  Refer to the "Links" section for chest pain algorithms and additional  guidance. Performed at [facility], [address] [place], [place] [address]   Brain natriuretic peptide     Status: None   Collection Time: 12/06/21  9:45 PM  Result Value Ref Range   B Natriuretic Peptide 93.6 0.0 - 100.0 pg/mL    Comment: Performed at [facility], [address]  [place], [place] [address]  CBG monitoring, ED     Status: Abnormal   Collection Time: 12/06/21  9:52 PM  Result Value Ref Range   Glucose-Capillary 162 (H) 70 - 99 mg/dL    Comment: Glucose reference range applies only to samples taken after fasting for at least 8 hours.  Troponin I (High Sensitivity)     Status: None   Collection Time: 12/07/21 12:39 AM  Result Value Ref Range   Troponin I (High Sensitivity) 10 <18 ng/L    Comment: (NOTE) Elevated high sensitivity troponin I (hsTnI) values and significant  changes across serial measurements may suggest ACS but many other  chronic and acute conditions are known to elevate hsTnI results.  Refer to the "Links" section for chest pain algorithms and additional  guidance. Performed at [facility], [address] [place], [place] [address]   CBG monitoring, ED     Status: Abnormal   Collection Time: 12/07/21  7:33 AM  Result Value Ref Range   Glucose-Capillary 188 (H) 70 - 99 mg/dL    Comment: Glucose reference range applies only to samples taken after fasting for at least 8 hours.   MR BRAIN WO CONTRAST  Result Date: 12/07/2021 CLINICAL DATA:  Initial evaluation for acute dizziness. EXAM: MRI HEAD WITHOUT CONTRAST TECHNIQUE: Multiplanar, multiecho pulse sequences of the brain and surrounding structures were obtained without intravenous contrast. COMPARISON:  Prior CT from 12/06/2021. FINDINGS: Brain: Generalized age-related cerebral atrophy. Mild chronic microvascular ischemic disease noted involving the supratentorial cerebral white matter. Small remote left cerebellar infarct noted. No evidence for acute or subacute ischemia. Gray-white matter differentiation maintained. No other areas of chronic cortical infarction. No acute intracranial hemorrhage. Single chronic microhemorrhage noted at the anterior left frontal lobe, nonspecific, but of doubtful significance in isolation. No mass lesion, midline shift or mass effect. No  hydrocephalus or extra-axial fluid collection. Pituitary gland suprasellar region normal. Vascular: Major intracranial vascular flow voids are maintained. Skull and upper cervical spine: Craniocervical junction within normal limits. Bone marrow signal intensity normal. No scalp soft tissue abnormality. Sinuses/Orbits: Postsurgical changes noted about the globes. Scattered chronic mucosal thickening noted throughout the paranasal sinuses. No mastoid effusion. Other: None. IMPRESSION: 1. No acute intracranial abnormality. 2. 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 Electronically Signed   By: Rise Mu M.D.   On: 12/07/2021 01:54   CT HEAD WO CONTRAST ( )  Result Date: 12/06/2021 CLINICAL DATA:  Dizziness, persistent/recurrent, cardiac or vascular cause suspected EXAM: CT HEAD  WITHOUT CONTRAST TECHNIQUE: Contiguous axial images were obtained from the base of the skull through the vertex without intravenous contrast. RADIATION DOSE REDUCTION: This exam was performed according to the departmental dose-optimization program which includes automated exposure control, adjustment of the mA and/or kV according to patient size and/or use of iterative reconstruction technique. COMPARISON:  None Available. FINDINGS: Brain: Age related volume loss. No acute intracranial abnormality. Specifically, no hemorrhage, hydrocephalus, mass lesion, acute infarction, or significant intracranial injury. Vascular: No hyperdense vessel or unexpected calcification. Skull: No acute calvarial abnormality. Sinuses/Orbits: No acute findings Other: None IMPRESSION: No acute intracranial abnormality. Electronically Signed   By: Charlett Nose M.D.   On: 12/06/2021 22:12   DG Chest 1 View  Result Date: 12/06/2021 CLINICAL DATA:  Hypertension EXAM: CHEST  1 VIEW COMPARISON:  None Available. FINDINGS: Heart and mediastinal contours are within normal limits. No focal opacities or  effusions. No acute bony abnormality. IMPRESSION: No active disease. Electronically Signed   By: Charlett Nose M.D.   On: 12/06/2021 22:11    Pending Labs Unresulted Labs (From admission, onward)     Start     Ordered   12/14/21 0500  Creatinine, serum  (enoxaparin (LOVENOX)    CrCl >/= 30 ml/min)  Weekly,   R     Comments: while on enoxaparin therapy    12/07/21 0311   12/07/21 0310  Creatinine, serum  (enoxaparin (LOVENOX)    CrCl >/= 30 ml/min)  Once,   R       Comments: Baseline for enoxaparin therapy IF NOT ALREADY DRAWN.    12/07/21 0311   12/07/21 0310  Hemoglobin A1c  (Glycemic Control (SSI)  Q 4 Hours / Glycemic Control (SSI)  AC +/- HS)  Once,   R       Comments: To assess prior glycemic control    12/07/21 0311            Vitals/Pain Today's Vitals   12/07/21 0530 12/07/21 0600 12/07/21 0630 12/07/21 0737  BP: (!) 172/78 (!) 194/81 (!) 179/79 (!) 178/84  Pulse: 65 62 (!) 58 74  Resp: 19  16 17   Temp:    97.9 F (36.6 C)  TempSrc:    Oral  SpO2: 99% 98% 99% 98%  Weight:      Height:      PainSc:    0-No pain    Isolation Precautions No active isolations  Medications Medications  enoxaparin (LOVENOX) injection 50 mg (has no administration in time range)  acetaminophen (TYLENOL) tablet 650 mg (has no administration in time range)    Or  acetaminophen (TYLENOL) suppository 650 mg (has no administration in time range)  ondansetron (ZOFRAN) tablet 4 mg (has no administration in time range)    Or  ondansetron (ZOFRAN) injection 4 mg (has no administration in time range)  insulin aspart (novoLOG) injection 0-15 Units (has no administration in time range)  insulin aspart (novoLOG) injection 0-5 Units (has no administration in time range)  carvedilol (COREG) tablet 25 mg (has no administration in time range)  losartan (COZAAR) tablet 100 mg (has no administration in time range)  aspirin EC tablet 81 mg (has no administration in time range)  labetalol  (NORMODYNE) injection 10 mg (has no administration in time range)  amLODipine (NORVASC) tablet 5 mg (5 mg Oral Given 12/06/21 2226)  ondansetron (ZOFRAN-ODT) disintegrating tablet 4 mg (4 mg Oral Given 12/06/21 2226)  lactated ringers bolus 1,000 mL (0 mLs Intravenous Stopped 12/07/21 0329)  ondansetron (  ZOFRAN) injection 4 mg (4 mg Intravenous Given 12/07/21 0036)  LORazepam (ATIVAN) injection 1 mg (1 mg Intravenous Given 12/07/21 0043)  meclizine (ANTIVERT) tablet 25 mg (25 mg Oral Given 12/07/21 0135)  dexamethasone (DECADRON) injection 8 mg (8 mg Intravenous Given 12/07/21 0356)    Mobility walks Moderate fall risk      R Recommendations: See Admitting Provider Note  Report given to:   Additional Notes:

## 2021-12-07 NOTE — Assessment & Plan Note (Addendum)
MRI showing old cerebellar stroke but nothing acute so possibly peripheral Continue meclizine, Zofran and will try a dose of Decadron and evaluate for therapeutic benefit as patient had little relief with meds administered in the ED Given findings on MRI we will consult neurology primarily to see if any additional inpatient investigation warranted We will continue aspirin

## 2021-12-07 NOTE — ED Notes (Signed)
Md notified of pt climbing BP again, no bp meds currently ordered

## 2021-12-07 NOTE — ED Notes (Signed)
Pt attempted to ambulate but felt dizzy, pt to be admitted.

## 2021-12-07 NOTE — H&P (Signed)
History and Physical    PatientTyrone Hunter VVO:[id number] DOB: 09/26/1945 DOA: 12/06/2021 DOS: the patient was seen and examined on 12/07/2021 PCP: Dion Body, MD  Patient coming from: Home  Chief Complaint:  Chief Complaint  Patient presents with   Hypertension    HPI: Kyle Hunter is a 76 y.o. male with medical history significant for DM, HTN, diastolic CHF who presents to the ED with sudden onset dizziness described as room spinning, associated with vomiting and unsteady gai.  He has an associated headache and noted that his blood pressure was elevated at home.  He denies double vision, one-sided numbness weakness or tingling in the face or extremities or difficulty speaking or swallowing.  He denies any recent illnesses.  Has no cough or congestion or fever or chills.  Denies prior history of a stroke. ED course and data review: On arrival BP 227/75 with otherwise normal vitals.  Blood work including CBC, BMP, troponin and BNP unremarkable.  EKG, personally viewed and interpreted: NSR with PVCs and RBBB but no acute ST or T wave changes.  Chest x-ray nonacute.  MRI brain with no acute intracranial abnormality.  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  He also received amlodipine for his blood pressure with improvement to 160/73 by admission.  Hospitalist consulted for admission.   Review of Systems: As mentioned in the history of present illness. All other systems reviewed and are negative.  Past Medical History:  Diagnosis Date   CHF (congestive heart failure) (HCC)    Diabetes mellitus without complication ([place])    Hypertension    History reviewed. No pertinent surgical history. Social History:  reports that he quit smoking about 31 years ago. His smoking use included cigarettes. He has never used smokeless tobacco. He reports  current alcohol use. He reports that he does not use drugs.  Allergies  Allergen Reactions   Penicillins Anaphylaxis and Hives   Shellfish Allergy Anaphylaxis and Hives   Lisinopril Cough    History reviewed. No pertinent family history.  Prior to Admission medications   Medication Sig Start Date End Date Taking? Authorizing Provider  ascorbic acid (VITAMIN C) 1000 MG tablet Take by mouth.    [provider]  aspirin 81 MG EC tablet Take by mouth.    [provider]  Blood Glucose Monitoring Suppl ([place]) w/Device KIT  04/09/20   [provider]  carvedilol (COREG) 12.5 MG tablet Take by mouth. 05/12/20   [provider]  Cholecalciferol (VITAMIN D3 PO) Take by mouth.    [provider]  glipiZIDE (GLUCOTROL XL) 5 MG 24 hr tablet Take by mouth. 06/02/20 06/02/21  [provider]  Lancets (ONETOUCH DELICA PLUS [id number]) [place] SMARTSIG:1 Topical Every Night 04/09/20   [provider]  losartan (COZAAR) 100 MG tablet Take by mouth. 03/05/20   [provider]  metFORMIN (GLUCOPHAGE) 500 MG tablet Take by mouth. 06/02/20   [provider]  Glory Rosebush VERIO test strip 1 each daily. 04/09/20   [provider]    Physical Exam: Vitals:   12/06/21 2145 12/07/21 0024 12/07/21 0142 12/07/21 0205  BP: (!) 227/75 (!) 212/88 (!) 188/81 (!) 160/73  Pulse: 71 62 67 68  Resp: 20 16 16 16   Temp: 98.2 F (36.8 C)     TempSrc: Oral     SpO2: 96% 95% 96%  96%  Weight:      Height:       Physical Exam Vitals and nursing note reviewed.  Constitutional:      General: He is not in acute distress. HENT:     Head: Normocephalic and atraumatic.  Cardiovascular:     Rate and Rhythm: Normal rate and regular rhythm.     Heart sounds: Normal heart sounds.  Pulmonary:     Effort: Pulmonary effort is normal.     Breath sounds: Normal breath sounds.  Abdominal:     Palpations: Abdomen is soft.      Tenderness: There is no abdominal tenderness.  Neurological:     Mental Status: Mental status is at baseline.     Labs on Admission: I have personally reviewed following labs and imaging studies  CBC: Recent Labs  Lab 12/06/21 2145  WBC 7.3  HGB 13.3  HCT 40.3  MCV 89.0  PLT 644   Basic Metabolic Panel: Recent Labs  Lab 12/06/21 2145  NA 141  K 4.0  CL 108  CO2 27  GLUCOSE 150*  BUN 28*  CREATININE 1.06  CALCIUM 9.4   GFR: Estimated Creatinine Clearance: 67.3 mL/min (by C-G formula based on SCr of 1.06 mg/dL). Liver Function Tests: No results for input(s): "AST", "Devera", "ALKPHOS", "BILITOT", "PROT", "ALBUMIN" in the last 168 hours. No results for input(s): "LIPASE", "AMYLASE" in the last 168 hours. No results for input(s): "AMMONIA" in the last 168 hours. Coagulation Profile: No results for input(s): "INR", "PROTIME" in the last 168 hours. Cardiac Enzymes: No results for input(s): "CKTOTAL", "CKMB", "CKMBINDEX", "TROPONINI" in the last 168 hours. BNP (last 3 results) No results for input(s): "PROBNP" in the last 8760 hours. HbA1C: No results for input(s): "HGBA1C" in the last 72 hours. CBG: Recent Labs  Lab 12/06/21 2152  GLUCAP 162*   Lipid Profile: No results for input(s): "CHOL", "HDL", "LDLCALC", "TRIG", "CHOLHDL", "LDLDIRECT" in the last 72 hours. Thyroid Function Tests: No results for input(s): "TSH", "T4TOTAL", "FREET4", "T3FREE", "THYROIDAB" in the last 72 hours. Anemia Panel: No results for input(s): "VITAMINB12", "FOLATE", "FERRITIN", "TIBC", "IRON", "RETICCTPCT" in the last 72 hours. Urine analysis: No results found for: "COLORURINE", "APPEARANCEUR", "LABSPEC", "PHURINE", "GLUCOSEU", "HGBUR", "BILIRUBINUR", "KETONESUR", "PROTEINUR", "UROBILINOGEN", "NITRITE", "LEUKOCYTESUR"  Radiological Exams on Admission: MR BRAIN WO CONTRAST  Result Date: 12/07/2021 CLINICAL DATA:  Initial evaluation for acute dizziness. EXAM: MRI HEAD WITHOUT CONTRAST  TECHNIQUE: Multiplanar, multiecho pulse sequences of the brain and surrounding structures were obtained without intravenous contrast. COMPARISON:  Prior CT from 12/06/2021. FINDINGS: Brain: Generalized age-related cerebral atrophy. Mild chronic microvascular ischemic disease noted involving the supratentorial cerebral white matter. Small remote left cerebellar infarct noted. No evidence for acute or subacute ischemia. Gray-white matter differentiation maintained. No other areas of chronic cortical infarction. No acute intracranial hemorrhage. Single chronic microhemorrhage noted at the anterior left frontal lobe, nonspecific, but of doubtful significance in isolation. No mass lesion, midline shift or mass effect. No hydrocephalus or extra-axial fluid collection. Pituitary gland suprasellar region normal. Vascular: Major intracranial vascular flow voids are maintained. Skull and upper cervical spine: Craniocervical junction within normal limits. Bone marrow signal intensity normal. No scalp soft tissue abnormality. Sinuses/Orbits: Postsurgical changes noted about the globes. Scattered chronic mucosal thickening noted throughout the paranasal sinuses. No mastoid effusion. Other: None. IMPRESSION: 1. No acute intracranial abnormality. 2. 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 Electronically Signed   By: Jeannine Boga M.D.   On: 12/07/2021  01:54   CT HEAD WO CONTRAST (5MM)  Result Date: 12/06/2021 CLINICAL DATA:  Dizziness, persistent/recurrent, cardiac or vascular cause suspected EXAM: CT HEAD WITHOUT CONTRAST TECHNIQUE: Contiguous axial images were obtained from the base of the skull through the vertex without intravenous contrast. RADIATION DOSE REDUCTION: This exam was performed according to the departmental dose-optimization program which includes automated exposure control, adjustment of the mA and/or kV according to patient size and/or use of  iterative reconstruction technique. COMPARISON:  None Available. FINDINGS: Brain: Age related volume loss. No acute intracranial abnormality. Specifically, no hemorrhage, hydrocephalus, mass lesion, acute infarction, or significant intracranial injury. Vascular: No hyperdense vessel or unexpected calcification. Skull: No acute calvarial abnormality. Sinuses/Orbits: No acute findings Other: None IMPRESSION: No acute intracranial abnormality. Electronically Signed   By: Rolm Baptise M.D.   On: 12/06/2021 22:12   DG Chest 1 View  Result Date: 12/06/2021 CLINICAL DATA:  Hypertension EXAM: CHEST  1 VIEW COMPARISON:  None Available. FINDINGS: Heart and mediastinal contours are within normal limits. No focal opacities or effusions. No acute bony abnormality. IMPRESSION: No active disease. Electronically Signed   By: Rolm Baptise M.D.   On: 12/06/2021 22:11     Data Reviewed: Relevant notes from primary care and specialist visits, past discharge summaries as available in EHR, including Care Everywhere. 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 but nothing acute so possibly peripheral Continue meclizine, Zofran and will try a dose of Decadron and evaluate for therapeutic benefit as patient had little relief with meds administered in the ED Given findings on MRI we will consult neurology primarily to see if any additional inpatient investigation warranted We will continue aspirin   Chronic diastolic CHF (congestive heart failure) (HCC) Continue losartan and carvedilol  Hypertensive urgency BP improved with amlodipine given in the ED Continue home losartan and  carvedilol  Diabetes mellitus without complication (HCC) Sliding scale insulin coverage        DVT prophylaxis: Lovenox  Consults: Neurologist, Dr. Curly Shores  Advance Care Planning:   Code Status: Not on file full  Family Communication: none  Disposition Plan: Back to previous home environment  Severity of Illness: The appropriate patient status for this patient is INPATIENT. Inpatient status is judged to be reasonable and necessary in order to provide the required intensity of service to ensure the patient's safety. The patient's presenting symptoms, physical exam findings, and initial radiographic and laboratory data in the context of their chronic comorbidities is felt to place them at high risk for further clinical deterioration. Furthermore, it is not anticipated that the patient will be medically stable for discharge from the hospital within 2 midnights of admission.   * I certify that at the point of admission it is my clinical judgment that the patient will require inpatient hospital care spanning beyond 2 midnights from the point of admission due to high intensity of service, high risk for further deterioration and high frequency of surveillance required.*  Author: Athena Masse, MD 12/07/2021 3:03 AM  For on call review [email or web address].

## 2021-12-07 NOTE — ED Notes (Signed)
Pt in bed, pt denies pain, pt reports decreased dizziness, resps even and unlabored

## 2021-12-07 NOTE — TOC Benefit Eligibility Note (Signed)
Patient Scientific laboratory technician completed.     The patient is currently admitted and upon discharge could be taking Eliquis or Xarelto.   The current 30 day co-pay is, $485.44; 480.50.   The patient is insured through [employer].

## 2021-12-07 NOTE — Progress Notes (Signed)
OT Cancellation Note  Patient Details Name: Leeroy Ruedas MRN: [id number] DOB: [birth year]   Cancelled Treatment:    Reason Eval/Treat Not Completed: Patient at procedure or test/ unavailable. Consult received, chart reviewed. Pt noted to be getting labs drawn. Will re-attempt OT evaluation at later time as pt is available.   Ardeth Perfect., MPH, MS, OTR/L ascom [phone] 12/07/21, 2:15 PM

## 2021-12-07 NOTE — Progress Notes (Addendum)
Brief same-day note:  Patient is a 76 year old male with history of diabetes type 2, hypertension, paroxysmal afib,diastolic CHF,H/O TIA ,HLD who presents with acute onset of dizziness yesterday with vomiting, unsteady gait.  No report of weakness or loss of consciousness.  On presentation he was hypertensive.  MRI of the brain was done which did not show any acute intracranial abnormality but showed chronic microvascular changes, remote left cerebral infarct.  BPPV was suspected.  Patient was started on IV fluids, meclizine, and Zofran. Patient seen and examined at the bedside this morning.  During my evaluation today, he was hemodynamically stable, still mildly hypertensive.  He is still complaining of some dizziness with feeling of room spinning around him. We have consulted neurology as well.  PT/OT have been consulted for possible vestibular therapy for BPPV.  Patient also reported that he has history of paroxysmal A-fib but did not want to be anticoagulated so not on any anticoagulation at present at home. On calculating, his CHA2DS2VASc comes around 6. EKG  done here shows NSR.  There is no documented evidence of A-fib on chart here. Charts at [facility] reviewed , no mention of atrial fibrillation on most of them.  I would recommend to follow-up with his own primary care physician and cardiologist for the management of paroxysmal A-fib.  Currently he is in normal sinus rhythm.   He has been started on Crestor for LDL of 142, new hemoglobin A1c pending. We will continue current management.  We will continue prn medications for severe hypertension and add other antihypertensives if blood pressure continues to remain high

## 2021-12-07 NOTE — Assessment & Plan Note (Signed)
BP improved with amlodipine given in the ED Continue home losartan and carvedilol

## 2021-12-07 NOTE — Evaluation (Signed)
Occupational Therapy Evaluation Patient Details Name: Kyle Hunter MRN: [id number] DOB: 06/18/1946 Today's Date: 12/07/2021   History of Present Illness Pt is a 76 yo male that presented to the ED for complaints of dizziness, elevated BP. PMH of CHF, DM, HTN.   Clinical Impression   Pt seen for OT evaluation this date. Pt lives in a 1 story home with his wife. Spouse and son present for session and endorse ability to provide assist as needed upon discharge. Pt was independent with mobility, ADL, and IADL prior to admission, staying active and enjoyed yard work and driving with his wife (family up [place], pt is primary driver). Pt currently presents with hx of diplopia that has worsened in the past day or so, leading to impaired balance/mobility/ADL/IADL participation. Pt describes it as the room spinning and feeling like a tilt-a-whirl. He also notes worsening symptoms with sudden positional changes and with visual scanning to the R side only Nystagmus noted with R visual scanning, not present in other directions. Pt able to complete bed mobility with supervision. VC for strategies to improve ADL transfers with RW and minimizing significant positional changes of head. Pt ambulated to/from the bathroom with SBA and supervision for toileting from standing position. Upon return to room, pt had 1 slight LOB requiring CGA to recover. Pt endorses turning too quickly. The visual deficits are impacting his balance and safety with mobility, ADL, and IADL. Pt/family instructed in visual compensaory strategies to minimize symptoms/nystagmus with R visual scanning, falls prevention stategies, adaptive strategies for higher environmental stimuli situations like grocery stores, encouraged speaking with MD regarding return to driving (introduced driving rehab resources), AE/DME, and home/routines modifications to maximize safety and independence with ADL and IADL. Pt/family verbalized understanding. Pt will benefit from  skilled OT Services to maximize return to high level independence at baseline. Recommend AIR at this time. If not feasible, will benefit from OP OT to address visual concerns and potentially driver rehabilitation program down the road.     Recommendations for follow up therapy are one component of a multi-disciplinary discharge planning process, led by the attending physician.  Recommendations may be updated based on patient status, additional functional criteria and insurance authorization.   Follow Up Recommendations  Acute inpatient rehab (3hours/day)    Assistance Recommended at Discharge Frequent or constant Supervision/Assistance  Patient can return home with the following A little help with walking and/or transfers;A little help with bathing/dressing/bathroom;Assistance with cooking/housework;Assist for transportation;Help with stairs or ramp for entrance    Functional Status Assessment  Patient has had a recent decline in their functional status and demonstrates the ability to make significant improvements in function in a reasonable and predictable amount of time.  Equipment Recommendations  Other (comment) (shower chair versus TTB, 2WW)    Recommendations for Other Services       Precautions / Restrictions Precautions Precautions: Fall Restrictions Weight Bearing Restrictions: No      Mobility Bed Mobility Overal bed mobility: Needs Assistance Bed Mobility: Supine to Sit, Sit to Supine     Supine to sit: Supervision, HOB elevated Sit to supine: Supervision, HOB elevated   General bed mobility comments: VC for compensatory strategies to minimize nystagmus/symptoms    Transfers Overall transfer level: Needs assistance Equipment used: Rolling walker (2 wheels) Transfers: Sit to/from Stand Sit to Stand: Min guard           General transfer comment: VC for hand placement, anterior weight shift prior to lift off to minimize ant/post rocking for  momentum       Balance Overall balance assessment: Needs assistance Sitting-balance support: Feet supported Sitting balance-Leahy Scale: Good     Standing balance support: Single extremity supported, No upper extremity supported Standing balance-Leahy Scale: Fair Standing balance comment: 1 slight LOB requiring CGA when he turned too quickly, fair dynamic standing balance with UE support on countertop                           ADL either performed or assessed with clinical judgement   ADL                                         General ADL Comments: Pt required SBA for grooming tasks at sink (increased challenge with materials placed on far right side to encourage use of learned compensatory stategies for vision), SBA for ADL mobility with RW requiring CGA for 1 slight LOB, VC for RW mgt, and remote supervision for standing toileting. 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 Now experiencing feeling of room spinning like a "tilt-a-whirl".) Vision Assessment?: Yes Eye Alignment: Within Functional Limits Ocular Range of Motion: Within Functional Limits;Impaired-to be further tested in functional context (with R gaze, nystagmus noted) Alignment/Gaze Preference: Within Defined Limits Convergence: Impaired - to be further tested in functional context (pt endorses his depth perception has been negatively impacted) Visual Fields: No apparent deficits Diplopia Assessment: Objects split on top of one another;Present all the time/all directions Depth Perception:  (pt unsure) Additional Comments: sudden positional changes and visual scanning to the far right side ellicit  nystagmus, other directions does not     Perception     Praxis      Pertinent Vitals/Pain Pain Assessment Pain Assessment: No/denies pain     Hand Dominance Right   Extremity/Trunk Assessment Upper Extremity Assessment Upper Extremity Assessment: Overall WFL for tasks assessed (no sensory, coordination, or strength deficits appreciated)   Lower Extremity Assessment Lower Extremity Assessment: Overall WFL for tasks assessed (no sensory, coordination, or strength deficits appreciated)   Cervical / Trunk Assessment Cervical / Trunk Assessment: Normal   Communication Communication Communication: No difficulties   Cognition Arousal/Alertness: Awake/alert Behavior During Therapy: WFL for tasks assessed/performed Overall Cognitive Status: Within Functional Limits for tasks assessed                                       General Comments       Exercises Other Exercises Other Exercises: Pt/family instructed in visual compensaory strategies to minimize symptoms/nystagmus with R visual scanning, falls prevention stategies, adaptive strategies for higher environmental stimuli situations like grocery stores, encouraged speaking with MD regarding return to driving (introduced driving rehab resources), AE/DME, and home/routines modifications to maximize safety and independence with ADL and IADL.   Shoulder Instructions      Home Living Family/patient expects to be discharged to:: Private residence (sons home, son lives on property in pool house) Living Arrangements: Spouse/significant other Available Help at Discharge: Family;Available 24 hours/day Type  of Home: House Home Access: Stairs to enter Entergy CorporationEntrance Stairs-Number of Steps: 1 threshold   Home Layout: One level     Bathroom Shower/Tub: Chief Strategy OfficerTub/shower unit   Bathroom Toilet: Standard     Home Equipment: None          Prior Functioning/Environment Prior Level of Function : Independent/Modified  Independent;Driving             Mobility Comments: performs IADLs ADLs Comments: active, does yard work, drives a lot with his wife (family in [place], road trips, pt is Database administratorprimary driver)        OT Problem List: Impaired vision/perception;Impaired balance (sitting and/or standing);Decreased knowledge of use of DME or AE      OT Treatment/Interventions: Self-care/ADL training;Therapeutic activities;DME and/or AE instruction;Visual/perceptual remediation/compensation;Patient/family education;Balance training    OT Goals(Current goals can be found in the care plan section) Acute Rehab OT Goals Patient Stated Goal: get better and go home OT Goal Formulation: With patient/family Time For Goal Achievement: 12/21/21 Potential to Achieve Goals: Good ADL Goals Additional ADL Goal #1: Pt will complete all aspects of showering, sitting as needed, utilizing learned compensatory strategies for vision to maximize safety/independence. Additional ADL Goal #2: Pt will complete IADL tasks involving dynamic sit/standing balance with modified independence, using learned compensatory strategies, 3/3 opportunities.  OT Frequency: Min 3X/week    Co-evaluation              AM-PAC OT "6 Clicks" Daily Activity     Outcome Measure Help from another person eating meals?: None Help from another person taking care of personal grooming?: A Little Help from another person toileting, which includes using toliet, bedpan, or urinal?: A Little Help from another person bathing (including washing, rinsing, drying)?: A Little Help from another person to put on and taking off regular upper body clothing?: None Help from another person to put on and taking off regular lower body clothing?: None 6 Click Score: 21   End of Session    Activity Tolerance: Patient tolerated treatment well Patient left: in bed;with call bell/phone within reach;with family/visitor present  OT Visit Diagnosis: Other abnormalities of gait  and mobility (R26.89);Low vision, both eyes (H54.2);Dizziness and giddiness (R42)                Time: 1422-1500 OT Time Calculation (min): 38 min Charges:  OT General Charges $OT Visit: 1 Visit OT Evaluation $OT Eval Moderate Complexity: 1 Mod OT Treatments $Self Care/Home Management : 23-37 mins  Arman FilterJamie R., MPH, MS, OTR/L ascom [phone] 12/07/21, 4:06 PM

## 2021-12-07 NOTE — Assessment & Plan Note (Signed)
Continue losartan and carvedilol 

## 2021-12-07 NOTE — Evaluation (Signed)
Physical Therapy Evaluation Patient Details Name: Kyle Hunter MRN: [id number] DOB: 19-Aug-1945 Today's Date: 12/07/2021  History of Present Illness  Pt is a 76 yo male that presented to the ED for complaints of dizziness, elevated BP. PMH of CHF, DM, HTN.  Clinical Impression  Patient alert, agreeable to PT, oriented x4. Denied pain, biggest complaint was dizziness, still present throughout session. He stated at baseline he is independent, lives with his wife no AD at home, denied any recent falls.  Upon assessment pt noted for nystagmus, initially thought only with supine R head roll test, but noted to be present at rest, and also appeared to be direction changing with superior gaze. BUE and BLE appeared to be WFLs, as well as coordination. He was able to perform supine to sit with bed rail, supervision, and sit <> stand with RW, and without, CGA. Without RW for ambulation pt unsteady, unsure of steps, decreased step length/stride length, minA. With RW pt able to ambulate to bathroom predominantly CGA, but did experience 1 LOB with minA to correct during turning.  Overall the patient demonstrated deficits (see "PT Problem List") that impede the patient's functional abilities, safety, and mobility and would benefit from skilled PT intervention. Recommendation at this time is inpatient rehab to maximize function, safety, and independence, pt with supportive family at home.         Recommendations for follow up therapy are one component of a multi-disciplinary discharge planning process, led by the attending physician.  Recommendations may be updated based on patient status, additional functional criteria and insurance authorization.  Follow Up Recommendations Acute inpatient rehab (3hours/day)    Assistance Recommended at Discharge Frequent or constant Supervision/Assistance  Patient can return home with the following  A little help with walking and/or transfers;A little help with  bathing/dressing/bathroom;Assistance with cooking/housework;Help with stairs or ramp for entrance;Assist for transportation    Equipment Recommendations Rolling walker (2 wheels)  Recommendations for Other Services       Functional Status Assessment Patient has had a recent decline in their functional status and demonstrates the ability to make significant improvements in function in a reasonable and predictable amount of time.     Precautions / Restrictions Precautions Precautions: Fall Restrictions Weight Bearing Restrictions: No      Mobility  Bed Mobility Overal bed mobility: Needs Assistance Bed Mobility: Supine to Sit     Supine to sit: Supervision, HOB elevated     General bed mobility comments: use of bed rails    Transfers Overall transfer level: Needs assistance Equipment used: Rolling walker (2 wheels), None Transfers: Sit to/from Stand Sit to Stand: Min guard                Ambulation/Gait Ambulation/Gait assistance: Min guard, Min assist Gait Distance (Feet): 50 Feet Assistive device: Rolling walker (2 wheels), None   Gait velocity: decreased     General Gait Details: with RW  CGA-minA due to one bout of unsteadiness during turning. without RW, minA and pt looking for UE support  Stairs            Wheelchair Mobility    Modified Rankin (Stroke Patients Only)       Balance Overall balance assessment: Needs assistance Sitting-balance support: Feet supported Sitting balance-Leahy Scale: Good       Standing balance-Leahy Scale: Fair           Rhomberg - Eyes Opened: 10 Rhomberg - Eyes Closed: 8   High Level Balance Comments: one step  forward, CGA some instability noted             Pertinent Vitals/Pain Pain Assessment Pain Assessment: No/denies pain    Home Living Family/patient expects to be discharged to:: Private residence (sons home, son lives on property in pool house) Living Arrangements: Spouse/significant  other Available Help at Discharge: Family;Available 24 hours/day Type of Home: House Home Access: Stairs to enter   [employer] of Steps: 1 threshold   Home Layout: One level Home Equipment: None      Prior Function Prior Level of Function : Independent/Modified Independent;Driving             Mobility Comments: performs IADLs       Hand Dominance        Extremity/Trunk Assessment   Upper Extremity Assessment Upper Extremity Assessment: Overall WFL for tasks assessed    Lower Extremity Assessment Lower Extremity Assessment: Overall WFL for tasks assessed (gross motor/sensation intact bilaterally)    Cervical / Trunk Assessment Cervical / Trunk Assessment: Normal  Communication   Communication: No difficulties  Cognition Arousal/Alertness: Awake/alert Behavior During Therapy: WFL for tasks assessed/performed Overall Cognitive Status: Within Functional Limits for tasks assessed                                          General Comments      Exercises     Assessment/Plan    PT Assessment Patient needs continued PT services  PT Problem List Decreased mobility;Decreased activity tolerance;Decreased balance;Decreased knowledge of use of DME       PT Treatment Interventions DME instruction;Therapeutic exercise;Gait training;Balance training;Stair training;Neuromuscular re-education;Functional mobility training;Therapeutic activities;Patient/family education    PT Goals (Current goals can be found in the Care Plan section)  Acute Rehab PT Goals Patient Stated Goal: to get back to normal PT Goal Formulation: With patient Time For Goal Achievement: 12/21/21 Potential to Achieve Goals: Good    Frequency 7X/week     Co-evaluation               AM-PAC PT "6 Clicks" Mobility  Outcome Measure Help needed turning from your back to your side while in a flat bed without using bedrails?: None Help needed moving from lying on  your back to sitting on the side of a flat bed without using bedrails?: None Help needed moving to and from a bed to a chair (including a wheelchair)?: A Little Help needed standing up from a chair using your arms (e.g., wheelchair or bedside chair)?: A Little Help needed to walk in hospital room?: A Little Help needed climbing 3-5 steps with a railing? : A Little 6 Click Score: 20    End of Session Equipment Utilized During Treatment: Gait belt Activity Tolerance: Patient tolerated treatment well Patient left: in chair;with chair alarm set;with call bell/phone within reach Nurse Communication: Mobility status PT Visit Diagnosis: Other abnormalities of gait and mobility (R26.89);Difficulty in walking, not elsewhere classified (R26.2);Muscle weakness (generalized) (M62.81)    Time: 1505-6979 PT Time Calculation (min) (ACUTE ONLY): 50 min   Charges:   PT Evaluation $PT Eval Low Complexity: 1 Low PT Treatments $Therapeutic Activity: 23-37 mins       Olga Coaster PT, DPT 12:23 PM,12/07/21

## 2021-12-07 NOTE — Consult Note (Signed)
ANTICOAGULATION CONSULT NOTE - Initial Consult  Pharmacy Consult for warfarin Indication: atrial fibrillation  Allergies  Allergen Reactions   Penicillins Anaphylaxis and Hives   Shellfish Allergy Anaphylaxis and Hives   Norvasc [Amlodipine] Nausea Only and Swelling    Nausea, light headed, lethargic, lip swelling   Lisinopril Cough    Patient Measurements: Height: 5\' 10"  (177.8 cm) Weight: 97.5 kg (215 lb) IBW/kg (Calculated) : 73   Vital Signs: Temp: 98.1 F (36.7 C) (06/13 0900) Temp Source: Oral (06/13 0900) BP: 162/82 (06/13 0900) Pulse Rate: 70 (06/13 0900)  Labs: Recent Labs    12/06/21 2145 12/07/21 0039 12/07/21 0852  HGB 13.3  --   --   HCT 40.3  --   --   PLT 205  --   --   CREATININE 1.06  --  0.94  TROPONINIHS 8 10  --     Estimated Creatinine Clearance: 75.9 mL/min (by C-G formula based on SCr of 0.94 mg/dL).   Medical History: Past Medical History:  Diagnosis Date   CHF (congestive heart failure) ([facility])    Diabetes mellitus without complication (HCC)    Hypertension     Medications:  Not on anticoagulation PTA   Assessment: 76 y.o. male with a past medical history of atrial fibrillation (not on anticoagulation due to desire to minimize medications), HTN, HLD, T2DM, and TIA. Patient presenting with sudden onset of dizziness, MRI negative for stroke. Pharmacy has been consulted for warfarin dosing for Afib. Per neurologist, will not bridge with Lovenox.      Baseline Labs: CBC and LFTs WNL, INR 1.1  Date INR Warfarin dose 6/13 1.1 7.5 mg  Goal of Therapy:  INR 2-3 Monitor platelets by anticoagulation protocol: Yes   Plan:  Will order warfarin 7.5 mg x 1 tonight  Monitor daily INR, CBC, s/s of bleed    Darnelle Bos, PharmD  12/07/2021,1:48 PM

## 2021-12-08 DIAGNOSIS — E119 Type 2 diabetes mellitus without complications: Secondary | ICD-10-CM | POA: Diagnosis not present

## 2021-12-08 DIAGNOSIS — I5032 Chronic diastolic (congestive) heart failure: Secondary | ICD-10-CM | POA: Diagnosis not present

## 2021-12-08 DIAGNOSIS — I160 Hypertensive urgency: Secondary | ICD-10-CM | POA: Diagnosis not present

## 2021-12-08 DIAGNOSIS — R42 Dizziness and giddiness: Secondary | ICD-10-CM | POA: Diagnosis not present

## 2021-12-08 LAB — PROTIME-INR
INR: 1 (ref 0.8–1.2)
Prothrombin Time: 13.5 s (ref 11.4–15.2)

## 2021-12-08 LAB — GLUCOSE, CAPILLARY
Glucose-Capillary: 227 mg/dL — ABNORMAL HIGH (ref 70–99)
Glucose-Capillary: 215 mg/dL — ABNORMAL HIGH (ref 70–99)

## 2021-12-08 MED ORDER — WARFARIN SODIUM 7.5 MG PO TABS: 7.5000 mg | ORAL_TABLET | Freq: Every day | ORAL | 0 refills | Status: DC

## 2021-12-08 MED ORDER — ROSUVASTATIN CALCIUM 20 MG PO TABS: 20.0000 mg | ORAL_TABLET | Freq: Every day | ORAL | 0 refills | Status: DC

## 2021-12-08 MED ORDER — MECLIZINE HCL 25 MG PO TABS
25.0000 mg | ORAL_TABLET | Freq: Three times a day (TID) | ORAL | 0 refills | Status: AC | PRN
Start: 2021-12-08 — End: ?

## 2021-12-08 MED ORDER — PANTOPRAZOLE SODIUM 40 MG PO TBEC: 40.0000 mg | DELAYED_RELEASE_TABLET | Freq: Every day | ORAL | 0 refills | Status: DC

## 2021-12-08 NOTE — Discharge Summary (Signed)
Physician Discharge Summary   Patient: Kyle Hunter MRN: [id number] DOB: [birth year]  Admit date:     12/06/2021  Discharge date: 12/08/21  Discharge Physician: Patrecia Pour   PCP: Dion Body, MD   Recommendations at discharge:  Check INR regularly while starting coumadin, discharged on 7.44m daily due to atrial fibrillation for secondary stroke risk reduction. Consider GI evaluation for severe reflux symptoms. Started on daily protonix at discharge  Discharge Diagnoses: Principal Problem:   Vertigo, intractable Active Problems:   Diabetes mellitus without complication (HCC)   Hypertensive urgency   Chronic diastolic CHF (congestive heart failure) ([facility]   Hospital Course: Patient is a 76year old male with history of diabetes type 2, hypertension, paroxysmal afib,diastolic CHF,H/O TIA ,HLD who presents with acute onset of dizziness yesterday with vomiting, unsteady gait.  On presentation he was hypertensive.  MRI of the brain was done which did not show any acute intracranial abnormality but showed chronic microvascular changes, remote left cerebral infarct.    Neurology was consulted and had high suspicion for MRI-negative brainstem CVA. With atrial fibrillation, they recommended anticoagulation. Coumadin has been started due to cost (~$15/month copay vs. ~$480/month with DOAC). Rosuvastatin also started with LDL of 142.   PT/OT initially recommended inpatient rehabilitation, though the patient's symptoms have significantly improved and he is felt safe to discharge home with assistance and home health therapies.   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 BLyn Recordsrecommendation.  - PT/OT to continue at home, driving restrictions reviewed.  - Continue symptomatic measures as well  Chronic diastolic CHF (congestive heart failure) (HCC) Continue losartan and carvedilol  Hypertensive  urgency: Improved.   Continue home losartan and carvedilol  T2DM: Controlled well with HbA1c 6.8%.  - Continue home med  Obesity: Body mass index is 30.85 kg/m.   Consultants: Neurology, Dr. BCurly ShoresProcedures performed: None  Disposition: Home Diet recommendation: Cardiac/carb modified DISCHARGE MEDICATION: Allergies as of 12/08/2021       Reactions   Penicillins Anaphylaxis, Hives   Shellfish Allergy Anaphylaxis, Hives   Amlodipine Nausea Only, Swelling   Nausea, light headed, lethargic, lip swelling   Lisinopril Cough        Medication List     STOP taking these medications    aspirin EC 81 MG tablet   hydrochlorothiazide 12.5 MG tablet Commonly known as: HYDRODIURIL       TAKE these medications    ascorbic acid 1000 MG tablet Commonly known as: VITAMIN C Take 1,000 mg by mouth daily.   carvedilol 25 MG tablet Commonly known as: COREG Take 25 mg by mouth 2 (two) times daily.   glipiZIDE 5 MG 24 hr tablet Commonly known as: GLUCOTROL XL Take 5 mg by mouth daily with breakfast.   losartan 100 MG tablet Commonly known as: COZAAR Take 100 mg by mouth daily.   meclizine 25 MG tablet Commonly known as: ANTIVERT Take 1 tablet (25 mg total) by mouth 3 (three) times daily as needed for dizziness.   metFORMIN 500 MG tablet Commonly known as: GLUCOPHAGE Take 500 mg by mouth daily.   OneTouch Delica Plus [id number] SMARTSIG:1 Topical Every Night   OneTouch Verio Flex System w/Device Kit   OneTouch Verio test strip Generic drug: glucose blood 1 each daily.   rosuvastatin 20 MG tablet Commonly known as: CRESTOR Take 1 tablet (20 mg total) by mouth daily. Start taking on: December 09, 2021  sildenafil 50 MG tablet Commonly known as: VIAGRA Take 25-50 mg by mouth daily as needed.   VITAMIN D3 PO Take 1 tablet by mouth daily.   warfarin 7.5 MG tablet Commonly known as: Coumadin Take 1 tablet (7.5 mg total) by mouth daily.        Follow-up  Information     Dion Body, MD Follow up.   Specialty: Family Medicine Contact information: [address] [place] [address] [phone]         Garvin Fila, MD. Schedule an appointment as soon as possible for a visit in 2 month(s).   Specialties: Neurology, Radiology Contact information: [address] [place] [address] [phone]                Discharge Exam: Danley Danker Weights   12/06/21 2141  Weight: 97.5 kg  BP (!) 162/69 (BP Location: Right Arm)   Pulse (!) 59   Temp 98.1 F (36.7 C) (Oral)   Resp 17   Ht 5' 10"  (1.778 m)   Wt 97.5 kg   SpO2 96%   BMI 30.85 kg/m   Pleasant obese male in no distress RRR, no MRG or edema Clear, nonlabored +Rightsided nystagmus, smooth pursuit without strabismus. Normal speech and no sensory or motor deficits on peripheral or cranial nerve exam.  Condition at discharge: stable  The results of significant diagnostics from this hospitalization (including imaging, microbiology, ancillary and laboratory) are listed below for reference.   Imaging Studies: CT ANGIO HEAD NECK W WO CM  Result Date: 12/07/2021 CLINICAL DATA:  Stroke/TIA, determine embolic source EXAM: CT ANGIOGRAPHY HEAD AND NECK TECHNIQUE: Multidetector CT imaging of the head and neck was performed using the standard protocol during bolus administration of intravenous contrast. Multiplanar CT image reconstructions and MIPs were obtained to evaluate the vascular anatomy. Carotid stenosis measurements (when applicable) are obtained utilizing NASCET criteria, using the distal internal carotid diameter as the denominator. RADIATION DOSE REDUCTION: This exam was performed according to the departmental dose-optimization program which includes automated exposure control, adjustment of the mA and/or kV according to patient size and/or use of iterative reconstruction technique. CONTRAST:  72m OMNIPAQUE IOHEXOL 350  MG/ML SOLN COMPARISON:  Recent CT and MR imaging FINDINGS: CT HEAD Brain: There is no acute intracranial hemorrhage, mass effect, or edema. Gray-white differentiation is preserved. There is no extra-axial fluid collection. Ventricles and sulci are stable in size and configuration. Patchy hypoattenuation in the supratentorial white matter probably reflects stable chronic microvascular ischemic changes. 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 Vascular: No new finding. Skull: Calvarium is unremarkable. Sinuses/Orbits: No acute finding. Other: None. Review of the MIP images confirms the above findings CTA NECK Aortic arch: Mild mixed plaque.  Great vessel origins are patent. Right carotid system: Patent. Minimal calcified plaque at the bifurcation. Stenosis. Left carotid system: Patent. Mild calcified plaque at the bifurcation. No stenosis. Vertebral arteries: Patent and codominant.  No stenosis. Skeleton: Cervical spine degenerative changes. Other neck: Unremarkable. Upper chest: Included upper lungs are clear. Review of the MIP images confirms the above findings CTA HEAD Anterior circulation: Intracranial internal carotid arteries patent. Anterior and middle cerebral arteries are patent. Azygous A2 ACA. Posterior circulation: Intracranial vertebral arteries are patent. Basilar artery is patent. Major cerebellar artery origins are patent. Posterior cerebral arteries are patent with mild atherosclerotic irregularity. Bilateral posterior communicating arteries are present. Venous sinuses: Patent as allowed by contrast bolus timing. Review of the MIP images confirms the  above findings IMPRESSION: No acute intracranial abnormality. No large vessel occlusion, hemodynamically significant stenosis, or evidence of dissection. Electronically Signed   By: Macy Mis M.D.   On: 12/07/2021 12:59   MR BRAIN WO CONTRAST  Result Date: 12/07/2021 CLINICAL DATA:  Initial evaluation for acute dizziness. EXAM: MRI HEAD  WITHOUT CONTRAST TECHNIQUE: Multiplanar, multiecho pulse sequences of the brain and surrounding structures were obtained without intravenous contrast. COMPARISON:  Prior CT from 12/06/2021. FINDINGS: Brain: Generalized age-related cerebral atrophy. Mild chronic microvascular ischemic disease noted involving the supratentorial cerebral white matter. Small remote left cerebellar infarct noted. No evidence for acute or subacute ischemia. Gray-white matter differentiation maintained. No other areas of chronic cortical infarction. No acute intracranial hemorrhage. Single chronic microhemorrhage noted at the anterior left frontal lobe, nonspecific, but of doubtful significance in isolation. No mass lesion, midline shift or mass effect. No hydrocephalus or extra-axial fluid collection. Pituitary gland suprasellar region normal. Vascular: Major intracranial vascular flow voids are maintained. Skull and upper cervical spine: Craniocervical junction within normal limits. Bone marrow signal intensity normal. No scalp soft tissue abnormality. Sinuses/Orbits: Postsurgical changes noted about the globes. Scattered chronic mucosal thickening noted throughout the paranasal sinuses. No mastoid effusion. Other: None. IMPRESSION: 1. No acute intracranial abnormality. 2. 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 Electronically Signed   By: Jeannine Boga M.D.   On: 12/07/2021 01:54   CT HEAD WO CONTRAST (5MM)  Result Date: 12/06/2021 CLINICAL DATA:  Dizziness, persistent/recurrent, cardiac or vascular cause suspected EXAM: CT HEAD WITHOUT CONTRAST TECHNIQUE: Contiguous axial images were obtained from the base of the skull through the vertex without intravenous contrast. RADIATION DOSE REDUCTION: This exam was performed according to the departmental dose-optimization program which includes automated exposure control, adjustment of the mA and/or kV according to patient size  and/or use of iterative reconstruction technique. COMPARISON:  None Available. FINDINGS: Brain: Age related volume loss. No acute intracranial abnormality. Specifically, no hemorrhage, hydrocephalus, mass lesion, acute infarction, or significant intracranial injury. Vascular: No hyperdense vessel or unexpected calcification. Skull: No acute calvarial abnormality. Sinuses/Orbits: No acute findings Other: None IMPRESSION: No acute intracranial abnormality. Electronically Signed   By: Rolm Baptise M.D.   On: 12/06/2021 22:12   DG Chest 1 View  Result Date: 12/06/2021 CLINICAL DATA:  Hypertension EXAM: CHEST  1 VIEW COMPARISON:  None Available. FINDINGS: Heart and mediastinal contours are within normal limits. No focal opacities or effusions. No acute bony abnormality. IMPRESSION: No active disease. Electronically Signed   By: Rolm Baptise M.D.   On: 12/06/2021 22:11    Microbiology: No results found for this or any previous visit.  Labs: CBC: Recent Labs  Lab 12/06/21 2145 12/07/21 1419  WBC 7.3 9.5  HGB 13.3 13.7  HCT 40.3 40.6  MCV 89.0 86.2  PLT 205 332   Basic Metabolic Panel: Recent Labs  Lab 12/06/21 2145 12/07/21 0852  NA 141  --   K 4.0  --   CL 108  --   CO2 27  --   GLUCOSE 150*  --   BUN 28*  --   CREATININE 1.06 0.94  CALCIUM 9.4  --    Liver Function Tests: Recent Labs  Lab 12/07/21 1419  AST 26  [place] 22  ALKPHOS 53  BILITOT 0.7  PROT 7.3  ALBUMIN 3.9   CBG: Recent Labs  Lab 12/07/21 1233 12/07/21 1632 12/07/21 2232 12/08/21 0937 12/08/21 1204  GLUCAP 224* 211* 212* 227*  215*    Discharge time spent: greater than 30 minutes.  Signed: Patrecia Pour, MD [facility] 12/08/2021

## 2021-12-08 NOTE — Progress Notes (Signed)
Occupational Therapy Treatment Patient Details Name: Kyle Hunter MRN: [id number] DOB: 30-Jan-1946 Today's Date: 12/08/2021   History of present illness Pt is a 76 yo male that presented to the ED for complaints of dizziness, elevated BP. PMH of CHF, DM, HTN.   OT comments  Chart reviewed, RN cleared pt for participation in OT tx session. Tx session targeted pt/family education re safe ADL completion at home since pt is hoping  to discharge home on this date per report, improving safe participation in functional mobility/ADL tasks. Pt with improved tolerance for activity performing amb to bathroom with RW and 140' in hall with RW with supervision. Pt with one R sided LOB as exiting the bathroom as this therapists entered for session, pt is able to self correct with walker. Education provided re: home safety, safe ADL completion, DME use, falls prevention. Pt and wife accept information with all questions answered. Wife reports she will assist with IADL as needed. Discharge recommendation has been updated to no follow up needed at this time. OT will follow acutely to address deficits.    Recommendations for follow up therapy are one component of a multi-disciplinary discharge planning process, led by the attending physician.  Recommendations may be updated based on patient status, additional functional criteria and insurance authorization.    Follow Up Recommendations  No OT follow up    Assistance Recommended at Discharge Frequent or constant Supervision/Assistance  Patient can return home with the following  A little help with walking and/or transfers;A little help with bathing/dressing/bathroom;Assistance with cooking/housework;Assist for transportation;Help with stairs or ramp for entrance   Equipment Recommendations  Tub/shower bench    Recommendations for Other Services      Precautions / Restrictions Precautions Precautions: Fall Restrictions Weight Bearing Restrictions: No        Mobility Bed Mobility               General bed mobility comments: NT pt exiting bathroom pre session, seated at edge of bed post session    Transfers Overall transfer level: Needs assistance   Transfers: Sit to/from Stand Sit to Stand: Modified independent (Device/Increase time)                 Balance Overall balance assessment: Needs assistance Sitting-balance support: Feet supported Sitting balance-Leahy Scale: Good     Standing balance support: No upper extremity supported, During functional activity, Bilateral upper extremity supported Standing balance-Leahy Scale: Poor                             ADL either performed or assessed with clinical judgement   ADL                                         General ADL Comments: Pt greeted when exiting bathroom, appeared to have a R LOB, pt self corrected with RW. Pt agrees he may have been rushing. Supervision for standing groomign tasks. Supervision for amb in hallway with RW approx 240'; Intermittent vcs required for technique/pacing. No other LOB noted during tx session.    Extremity/Trunk Assessment              Vision       Perception     Praxis      Cognition Arousal/Alertness: Awake/alert Behavior During Therapy: WFL for tasks assessed/performed Overall Cognitive Status: Within Functional  Limits for tasks assessed                                          Exercises Other Exercises Other Exercises: Pt/family provided education re: home safety, falls prevention, DME use, all questions answered approrpiately    Shoulder Instructions       General Comments      Pertinent Vitals/ Pain       Pain Assessment Pain Assessment: No/denies pain  Home Living                                          Prior Functioning/Environment              Frequency  Min 3X/week        Progress Toward Goals  OT Goals(current goals  can now be found in the care plan section)  Progress towards OT goals: Progressing toward goals     Plan Discharge plan needs to be updated    Co-evaluation                 AM-PAC OT "6 Clicks" Daily Activity     Outcome Measure   Help from another person eating meals?: None Help from another person taking care of personal grooming?: None Help from another person toileting, which includes using toliet, bedpan, or urinal?: None Help from another person bathing (including washing, rinsing, drying)?: A Little Help from another person to put on and taking off regular upper body clothing?: None Help from another person to put on and taking off regular lower body clothing?: None 6 Click Score: 23    End of Session Equipment Utilized During Treatment: Gait belt;Rolling walker (2 wheels)  OT Visit Diagnosis: Other abnormalities of gait and mobility (R26.89);Low vision, both eyes (H54.2);Dizziness and giddiness (R42)   Activity Tolerance Patient tolerated treatment well   Patient Left in bed;with call bell/phone within reach;with family/visitor present   Nurse Communication Mobility status        Time: [id number] OT Time Calculation (min): 13 min  Charges: OT General Charges $OT Visit: 1 Visit OT Treatments $Self Care/Home Management : 8-22 mins  Shanon Payor, OTD OTR/L  12/08/21, 2:51 PM

## 2021-12-08 NOTE — Progress Notes (Signed)
Physical Therapy Treatment Patient Details Name: Kyle Hunter MRN: [id number] DOB: Jul 11, 1945 Today's Date: 12/08/2021   History of Present Illness Pt is a 76 yo male that presented to the ED for complaints of dizziness, elevated BP. PMH of CHF, DM, HTN.    PT Comments    Pt was pleasant and motivated to participate during the session and put forth good effort throughout. Pt presents with improved nystagmus and reports dizziness has decreased compared to last session. Pt is able to complete bed mobility w/ supervision w/ HOB elevated. Pt able to complete sit to stand transfers w/ CGA provided but not needed. Pt was able to ambulate around nurses station 326ft using RW w/ CGA with good stability and no LOB. When performing head turns while walking pt gait speed decreases and much more cautious with movement. Pt able to ambulate 67ft w/o AD but still with some mild unsteadiness and guarded gait but no LOB.  Pt able to complete high level balance activities w/ CGA and some min cuing for sequencing; cautious and guarded with movement but no LOB. Pt will benefit from OPPT upon discharge to safely address deficits listed in patient problem list for decreased caregiver assistance and eventual return to PLOF.    Recommendations for follow up therapy are one component of a multi-disciplinary discharge planning process, led by the attending physician.  Recommendations may be updated based on patient status, additional functional criteria and insurance authorization.  Follow Up Recommendations  Outpatient PT     Assistance Recommended at Discharge Intermittent Supervision/Assistance  Patient can return home with the following A little help with walking and/or transfers;A little help with bathing/dressing/bathroom;Assistance with cooking/housework;Help with stairs or ramp for entrance;Assist for transportation   Equipment Recommendations  Rolling walker (2 wheels)    Recommendations for Other Services        Precautions / Restrictions Precautions Precautions: Fall Restrictions Weight Bearing Restrictions: No     Mobility  Bed Mobility   Bed Mobility: Supine to Sit     Supine to sit: Supervision, HOB elevated          Transfers Overall transfer level: Needs assistance Equipment used: Rolling walker (2 wheels) Transfers: Sit to/from Stand Sit to Stand: Min guard                Ambulation/Gait Ambulation/Gait assistance: Min guard Gait Distance (Feet): 320 Feet; 50 Feet w/ no AD Assistive device: Rolling walker (2 wheels), None Gait Pattern/deviations: WFL(Within Functional Limits)       General Gait Details: good stability and no LOB; decreased speed with head turns   Optometrist    Modified Rankin (Stroke Patients Only)       Balance Overall balance assessment: Needs assistance Sitting-balance support: Feet supported Sitting balance-Leahy Scale: Good     Standing balance support: No upper extremity supported, During functional activity, Bilateral upper extremity supported Standing balance-Leahy Scale: Fair               High level balance activites: Side stepping, Backward walking, Head turns High Level Balance Comments: decreased gait speed with head turns; decreased step length when moving backwards; no LOB            Cognition Arousal/Alertness: Awake/alert Behavior During Therapy: WFL for tasks assessed/performed Overall Cognitive Status: Within Functional Limits for tasks assessed  Exercises      General Comments        Pertinent Vitals/Pain Pain Assessment Pain Assessment: No/denies pain    Home Living                          Prior Function            PT Goals (current goals can now be found in the care plan section) Progress towards PT goals: Progressing toward goals    Frequency    Min 2X/week       PT Plan Discharge plan needs to be updated    Co-evaluation              AM-PAC PT "6 Clicks" Mobility   Outcome Measure  Help needed turning from your back to your side while in a flat bed without using bedrails?: None Help needed moving from lying on your back to sitting on the side of a flat bed without using bedrails?: None Help needed moving to and from a bed to a chair (including a wheelchair)?: A Little Help needed standing up from a chair using your arms (e.g., wheelchair or bedside chair)?: A Little Help needed to walk in hospital room?: A Little Help needed climbing 3-5 steps with a railing? : A Little 6 Click Score: 20    End of Session Equipment Utilized During Treatment: Gait belt Activity Tolerance: Patient tolerated treatment well Patient left: in bed;with call bell/phone within reach Nurse Communication: Mobility status PT Visit Diagnosis: Other abnormalities of gait and mobility (R26.89);Difficulty in walking, not elsewhere classified (R26.2);Muscle weakness (generalized) (M62.81)     Time: 9937-1696 PT Time Calculation (min) (ACUTE ONLY): 20 min  Charges:                        Marica Otter, SPT  12/08/2021, 11:59 AM

## 2021-12-08 NOTE — Progress Notes (Signed)
Inpatient Rehab Admissions Coordinator:   Reviewed chart for updates.  Pt supervision/CGA with PT/OT, distant supervision for toileting with OT.  Does not require the intensity of CIR at this time, and continues to remain obs status.  Recommend TOC evaluate for alternative rehab settings for f/u.  CIR will sign off at this time.   Estill Dooms, PT, DPT Admissions Coordinator [phone] 12/08/21  10:28 AM

## 2021-12-08 NOTE — TOC Initial Note (Addendum)
Transition of Care [facility]) - Initial/Assessment Note    Patient Details  Name: Kyle Hunter MRN: [id number] Date of Birth: 05-18-1946  [facility] CM/SW Contact:    Beverly Sessions, RN Phone Number: 12/08/2021, 2:15 PM  Clinical Narrative:                  Admitted for: vertigo Admitted from:home with wife PCP: [place]: [facility] Current home health/prior home health/DME: NA     PT has upgraded therapy recommendations to outpatient PT  Patient will also require INR checks.  Patient agreeable to home health at they will be able to provide his PT services and INR checks  Patient states he does not have a preference of home health agency.  Referral made to [facility] with [facility]  RW to be delivered by [facility] prior to discharge    Update.  [facility] unable to accept.  [facility] with [facility] has accepted referral   Patient Goals and CMS Choice        Expected Discharge Plan and Services           Expected Discharge Date: 12/08/21                                    Prior Living Arrangements/Services                       Activities of Daily Living Home Assistive Devices/Equipment: Eyeglasses ADL Screening (condition at time of admission) Patient's cognitive ability adequate to safely complete daily activities?: Yes Is the patient deaf or have difficulty hearing?: No Does the patient have difficulty seeing, even when wearing glasses/contacts?: No Does the patient have difficulty concentrating, remembering, or making decisions?: No Patient able to express need for assistance with ADLs?: Yes Does the patient have difficulty dressing or bathing?: No Independently performs ADLs?: Yes (appropriate for developmental age) Does the patient have difficulty walking or climbing stairs?: No Weakness of Legs: None Weakness of Arms/Hands: None  Permission Sought/Granted                  Emotional Assessment               Admission diagnosis:  Vertigo [R42] Hypertension, unspecified type [I10] Patient Active Problem List   Diagnosis Date Noted   Vertigo, intractable 12/07/2021   Diabetes mellitus without complication ([place])    Hypertensive urgency    Chronic diastolic CHF (congestive heart failure) ([place])    PCP:  Dion Body, MD Pharmacy:   [facility] [address] [place] [facility] [place] [address] [place] [address] Phone: [phone] Fax: [phone]     Social Determinants of Health (SDOH) Interventions    Readmission Risk Interventions     No data to display

## 2022-03-03 ENCOUNTER — Ambulatory Visit (INDEPENDENT_AMBULATORY_CARE_PROVIDER_SITE_OTHER): Payer: Medicare Other | Admitting: Neurology

## 2022-03-03 ENCOUNTER — Encounter: Payer: Self-pay | Admitting: Neurology

## 2022-03-03 VITALS — BP 173/93 | HR 81 | Ht 71.0 in | Wt 256.0 lb

## 2022-03-03 DIAGNOSIS — Z8673 Personal history of transient ischemic attack (TIA), and cerebral infarction without residual deficits: Secondary | ICD-10-CM | POA: Diagnosis not present

## 2022-03-03 DIAGNOSIS — R42 Dizziness and giddiness: Secondary | ICD-10-CM | POA: Diagnosis not present

## 2022-03-03 DIAGNOSIS — I639 Cerebral infarction, unspecified: Secondary | ICD-10-CM | POA: Diagnosis not present

## 2022-03-03 DIAGNOSIS — I480 Paroxysmal atrial fibrillation: Secondary | ICD-10-CM | POA: Diagnosis not present

## 2022-03-03 NOTE — Progress Notes (Signed)
[facility] [address] [place] [address] [phone]       OFFICE CONSULT NOTE  Mr. Kyle Hunter Date of Birth:  01-16-1946 Medical Record Number:  [id number]   Referring MD: Vance Gather  Reason for Referral: Vertigo  HPI: Mr. Kyle Hunter is a 76 year old Caucasian male seen today for initial office consultation visit for vertigo.  History is obtained from the patient and review of electronic medical records and opossum reviewed pertinent available imaging films in PACS. Kyle Hunter is a 76 y.o. 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 He follows at [facility] for most of his care and was last seen by endocrinology and internal medicine on 10/25/2021 (Dr. Gabriel Carina and Dr. Netty Starring).He reports that he was in his usual state of health on 6/12, laying down and watching TV when he had sudden onset dizziness with room spinning, nausea, vomiting, feeling diaphoretic at the time but no other recent signs or symptoms of illness.  This was associated with a headache and blood pressure elevation.  His symptoms somewhat improved while he was in the ED but he is noted still to be quite unsteady with ambulation and therefore was admitted.  Neurology was consulted the next day in the morning.  He has some chronic double vision related to his retinal detachment and repair especially worse in the left lower quadrant typically in the morning.  She denies any new double vision accompanying the symptoms.  He has longstanding  history of A-fib which is asymptomatic and he has not been on anticoagulation due to his desire to minimize medications.  CT head did not show any acute abnormality and CT angiogram of the brain and neck showed no large vessel stenosis or occlusion.  MRI scan was negative for any acute infarct.  He subsequently had LDL cholesterol on 01/21/2022 which had come down to 101 after previously being elevated at 142 on 12/08/2019 for which she was started on statin.  His hemoglobin A1c was 6.8.  Patient states is done well and has had no further recurrent episodes of dizziness or vertigo.  He does complain of constant tinnitus but denies any decreased hearing.  He denies any prior history of benign positional vertigo, recurrent ear infections or head trauma.  He does admit to history of TIA 20 years ago when he had left-sided face and body numbness which lasted around 20 minutes and recovered completely.  He has known atrial fibrillation for 20 years for which she takes aspirin and he has chosen not to be on anticoagulation. ROS:   14 system review of systems is positive for dizziness, nausea, vertigo, gait imbalance, vomiting all other systems negative  PMH:  Past Medical History:  Diagnosis Date   CHF (congestive heart failure) (HCC)    Diabetes mellitus without complication ([place])    Hypertension     Social History:  Social History   Socioeconomic History   Marital status: Married    Spouse name: Not on file   Number of children: Not on file   Years of education: Not on file   Highest education level: Not on  file  Occupational History   Not on file  Tobacco Use   Smoking status: Former    Types: Cigarettes    Quit date: 06/04/1990    Years since quitting: 31.7   Smokeless tobacco: Never  Substance and Sexual Activity   Alcohol use: Yes    Alcohol/week: 0.0 - 1.0 standard drinks of alcohol    Comment: occasional   Drug use: Never   Sexual activity: Not on file  Other Topics Concern   Not  on file  Social History Narrative   Not on file   Social Determinants of Health   Financial Resource Strain: Not on file  Food Insecurity: Not on file  Transportation Needs: Not on file  Physical Activity: Not on file  Stress: Not on file  Social Connections: Not on file  Intimate Partner Violence: Not on file    Medications:   Current Outpatient Medications on File Prior to Visit  Medication Sig Dispense Refill   ascorbic acid (VITAMIN C) 1000 MG tablet Take 1,000 mg by mouth daily.     Blood Glucose Monitoring Suppl (ONETOUCH VERIO FLEX SYSTEM) w/Device KIT      carvedilol (COREG) 25 MG tablet Take 25 mg by mouth 2 (two) times daily.     Cholecalciferol (VITAMIN D3 PO) Take 1 tablet by mouth daily.     Lancets (ONETOUCH DELICA PLUS [id number]) MISC SMARTSIG:1 Topical Every Night     losartan (COZAAR) 100 MG tablet Take 100 mg by mouth daily.     meclizine (ANTIVERT) 25 MG tablet Take 1 tablet (25 mg total) by mouth 3 (three) times daily as needed for dizziness. 30 tablet 0   metFORMIN (GLUCOPHAGE) 500 MG tablet Take 500 mg by mouth daily.     ONETOUCH VERIO test strip 1 each daily.     sildenafil (VIAGRA) 50 MG tablet Take 25-50 mg by mouth daily as needed.     glipiZIDE (GLUCOTROL XL) 5 MG 24 hr tablet Take 5 mg by mouth daily with breakfast.     No current facility-administered medications on file prior to visit.    Allergies:   Allergies  Allergen Reactions   Penicillins Anaphylaxis and Hives   Shellfish Allergy Anaphylaxis and Hives   Amlodipine Nausea Only and Swelling    Nausea, light headed, lethargic, lip swelling   Lisinopril Cough    Physical Exam General: Obese pleasant elderly male, seated, in no evident distress Head: head normocephalic and atraumatic.   Neck: supple with no carotid or supraclavicular bruits Cardiovascular: regular rate and rhythm, no murmurs Musculoskeletal: no deformity Skin:  no rash/petichiae Vascular:  Normal pulses all  extremities  Neurologic Exam Mental Status: Awake and fully alert. Oriented to place and time. Recent and remote memory intact. Attention span, concentration and fund of knowledge appropriate. Mood and affect appropriate.  Cranial Nerves: Fundoscopic exam reveals sharp disc margins. Pupils equal, briskly reactive to light. Extraocular movements full without nystagmus. Visual fields full to confrontation. Hearing intact. Facial sensation intact. Face, tongue, palate moves normally and symmetrically.  Motor: Normal bulk and tone. Normal strength in all tested extremity muscles. Sensory.: intact to touch , pinprick , position and vibratory sensation.  Coordination: Rapid alternating movements normal in all extremities. Finger-to-nose and heel-to-shin performed accurately bilaterally.  Headshaking does not produce any subjective vertigo or objective nystagmus.  Positive Fukuda test with patient clearly moving of base and rotating to the left. Gait and Station: Arises from chair without difficulty. Stance is normal. Gait demonstrates normal  stride length and balance . Able to heel, toe and tandem walk with moderate difficulty.  Reflexes: 1+ and symmetric. Toes downgoing.   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  Brainstem TIA is possible though less likely.     PLAN: I had a long discussion with the patient and his wife regarding his episode of transient vertigo and nausea which probably represent episode of peripheral vestibular dysfunction rather than a brainstem TIA due to lack of associated symptoms.  I recommend referral to vestibular rehab for dizziness exercises.  Continue aspirin for stroke prevention as patient is refusing anticoagulation for his chronic A-fib.  Maintain aggressive risk factor modification with strict control of hypertension with blood pressure goal  below 130/90, lipids with LDL cholesterol goal below 70 mg percent and diabetes with hemoglobin A1c goal below 6.5%.  I also encouraged him to eat a healthy diet with lots of fruits, vegetables, cereals and whole grains and to avoid animal meat.  He was also encouraged to exercise regularly and lose weight.  Return for follow-up in the future only as needed and no schedule appointment is necessary.  Greater than 50% time during this 45-minute consultation visit was spent on counseling and coordination of care about his episode of dizziness and atrial fibrillation and discussion of stroke prevention and treatment and answering questions. Antony Contras, MD  Note: This document was prepared with digital dictation and possible smart phrase technology. Any transcriptional errors that result from this process are unintentional.

## 2022-03-03 NOTE — Patient Instructions (Signed)
I had a long discussion with the patient and his wife regarding his episode of transient vertigo and nausea which probably represent episode of peripheral vestibular dysfunction rather than a brainstem TIA due to lack of associated symptoms.  I recommend referral to vestibular rehab for dizziness exercises.  Continue aspirin for stroke prevention as patient is refusing anticoagulation for his chronic A-fib.  Maintain aggressive risk factor modification with strict control of hypertension with blood pressure goal below 130/90, lipids with LDL cholesterol goal below 70 mg percent and diabetes with hemoglobin A1c goal below 6.5%.  I also encouraged him to eat a healthy diet with lots of fruits, vegetables, cereals and whole grains and to avoid animal meat.  He was also encouraged to exercise regularly and lose weight.  Return for follow-up in the future only as needed and no schedule appointment is necessary.  Dizziness Dizziness is a common problem. It is a feeling of unsteadiness or light-headedness. You may feel like you are about to faint. Dizziness can lead to injury if you stumble or fall. Anyone can become dizzy, but dizziness is more common in older adults. This condition can be caused by a number of things, including medicines, dehydration, or illness. Follow these instructions at home: Eating and drinking  Drink enough fluid to keep your urine pale yellow. This helps to keep you from becoming dehydrated. Try to drink more clear fluids, such as water. Do not drink alcohol. Limit your caffeine intake if told to do so by your health care provider. Check ingredients and nutrition facts to see if a food or beverage contains caffeine. Limit your salt (sodium) intake if told to do so by your health care provider. Check ingredients and nutrition facts to see if a food or beverage contains sodium. Activity  Avoid making quick movements. Rise slowly from chairs and steady yourself until you feel okay. In  the morning, first sit up on the side of the bed. When you feel okay, stand slowly while you hold onto something until you know that your balance is good. If you need to stand in one place for a long time, move your legs often. Tighten and relax the muscles in your legs while you are standing. Do not drive or use machinery if you feel dizzy. Avoid bending down if you feel dizzy. Place items in your home so that they are easy for you to reach without leaning over. Lifestyle Do not use any products that contain nicotine or tobacco. These products include cigarettes, chewing tobacco, and vaping devices, such as e-cigarettes. If you need help quitting, ask your health care provider. Try to reduce your stress level by using methods such as yoga or meditation. Talk with your health care provider if you need help to manage your stress. General instructions Watch your dizziness for any changes. Take over-the-counter and prescription medicines only as told by your health care provider. Talk with your health care provider if you think that your dizziness is caused by a medicine that you are taking. Tell a friend or a family member that you are feeling dizzy. If he or she notices any changes in your behavior, have this person call your health care provider. Keep all follow-up visits. This is important. Contact a health care provider if: Your dizziness does not go away or you have new symptoms. Your dizziness or light-headedness gets worse. You feel nauseous. You have reduced hearing. You have a fever. You have neck pain or a stiff neck. Your dizziness  leads to an injury or a fall. Get help right away if: You vomit or have diarrhea and are unable to eat or drink anything. You have problems talking, walking, swallowing, or using your arms, hands, or legs. You feel generally weak. You have any bleeding. You are not thinking clearly or you have trouble forming sentences. It may take a friend or family  member to notice this. You have chest pain, abdominal pain, shortness of breath, or sweating. Your vision changes or you develop a severe headache. These symptoms may represent a serious problem that is an emergency. Do not wait to see if the symptoms will go away. Get medical help right away. Call your local emergency services (911 in the [place] Do not drive yourself to the hospital. Summary Dizziness is a feeling of unsteadiness or light-headedness. This condition can be caused by a number of things, including medicines, dehydration, or illness. Anyone can become dizzy, but dizziness is more common in older adults. Drink enough fluid to keep your urine pale yellow. Do not drink alcohol. Avoid making quick movements if you feel dizzy. Monitor your dizziness for any changes. This information is not intended to replace advice given to you by your health care provider. Make sure you discuss any questions you have with your health care provider. Document Revised: 05/18/2020 Document Reviewed: 05/18/2020 Elsevier Patient Education  2023 [employer].

## 2023-03-17 ENCOUNTER — Other Ambulatory Visit (HOSPITAL_COMMUNITY): Payer: Self-pay | Admitting: Internal Medicine

## 2023-03-17 DIAGNOSIS — Z13820 Encounter for screening for osteoporosis: Secondary | ICD-10-CM

## 2023-05-18 IMAGING — CT CT ANGIO HEAD-NECK (W OR W/O PERF)
2 of 11 series · 6 of 36 positions shown · IV contrast (APPLIED)
Comparison: Recent CT and MR imaging

CLINICAL DATA: Stroke/TIA, determine embolic source

EXAM:
CT ANGIOGRAPHY HEAD AND NECK
TECHNIQUE: Multidetector CT imaging of the head and neck was performed using
the standard protocol during bolus administration of intravenous
contrast. Multiplanar CT image reconstructions and MIPs were
obtained to evaluate the vascular anatomy. Carotid stenosis
measurements (when applicable) are obtained utilizing NASCET
criteria, using the distal internal carotid diameter as the
denominator.

[Series 9: sagittal soft tissue · sagittal · 0.32mm/px · 1 of 62 slices shown]
[im 9/62  soft-tissue]
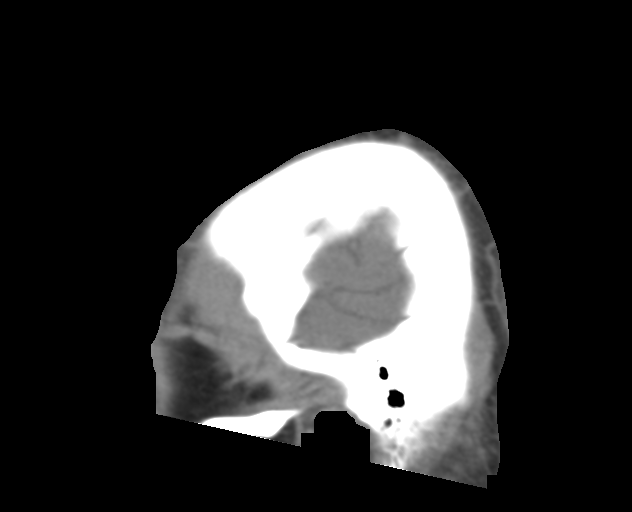

[Series 11: ax thin · axial · 0.51mm/px · z∈[-247,+5]mm · 5 of 381 slices shown]
[im 64/381  soft-tissue]
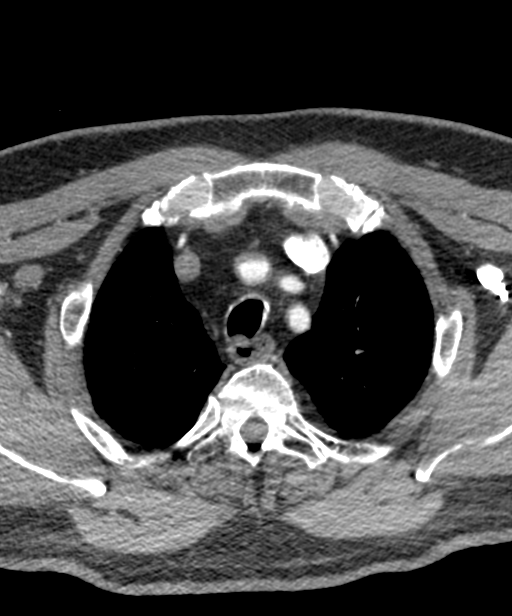
[im 127/381  bone]
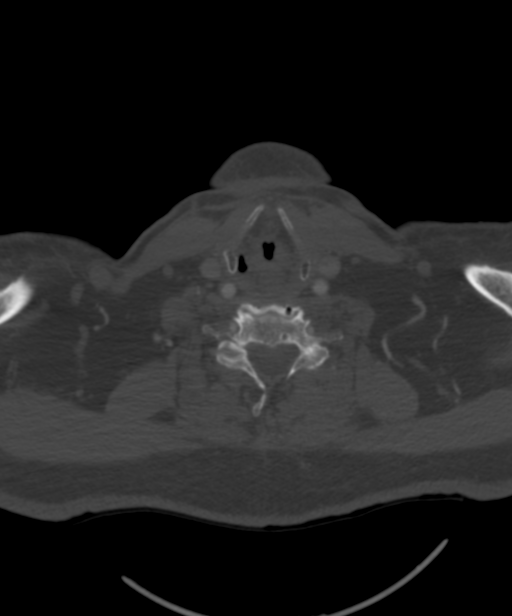
[im 191/381  soft-tissue]
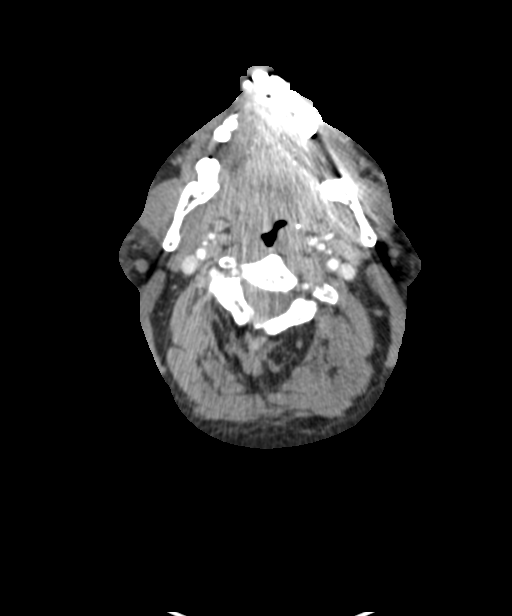
[im 254/381  bone]
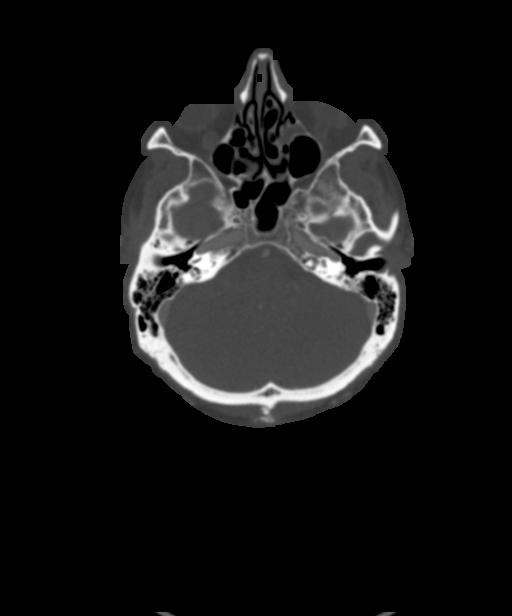
[im 317/381  soft-tissue]
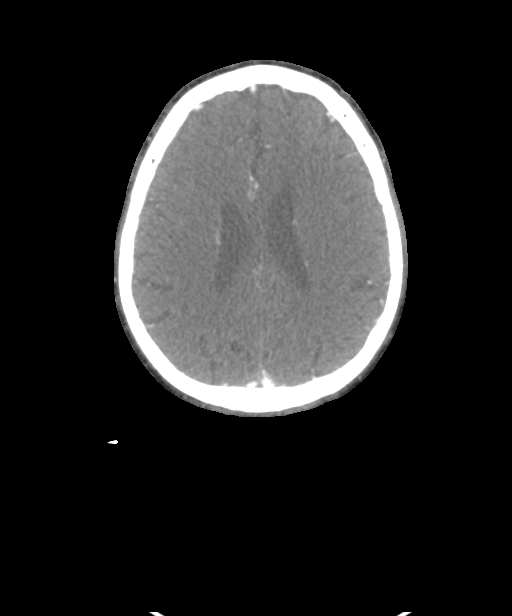

[6 of 36 positions shown; findings below may reference images not displayed]

RADIATION DOSE REDUCTION: This exam was performed according to the
departmental dose-optimization program which includes automated
exposure control, adjustment of the mA and/or kV according to
patient size and/or use of iterative reconstruction technique.

CONTRAST:  75mL OMNIPAQUE IOHEXOL 350 MG/ML SOLN
FINDINGS: CT HEAD

Brain: There is no acute intracranial hemorrhage, mass effect, or
edema. Gray-white differentiation is preserved. There is no
extra-axial fluid collection. Ventricles and sulci are stable in
size and configuration. Patchy hypoattenuation in the supratentorial
white matter probably reflects stable chronic microvascular ischemic
changes. There is a chronic left cerebellar infarct.

Vascular: No new finding.

Skull: Calvarium is unremarkable.

Sinuses/Orbits: No acute finding.

Other: None.

Review of the MIP images confirms the above findings

CTA NECK

Aortic arch: Mild mixed plaque.  Great vessel origins are patent.

Right carotid system: Patent. Minimal calcified plaque at the
bifurcation. Stenosis.

Left carotid system: Patent. Mild calcified plaque at the
bifurcation. No stenosis.

Vertebral arteries: Patent and codominant.  No stenosis.

Skeleton: Cervical spine degenerative changes.

Other neck: Unremarkable.

Upper chest: Included upper lungs are clear.

Review of the MIP images confirms the above findings

CTA HEAD

Anterior circulation: Intracranial internal carotid arteries patent.
Anterior and middle cerebral arteries are patent. Azygous A2 ACA.

Posterior circulation: Intracranial vertebral arteries are patent.
Basilar artery is patent. Major cerebellar artery origins are
patent. Posterior cerebral arteries are patent with mild
atherosclerotic irregularity. Bilateral posterior communicating
arteries are present.

Venous sinuses: Patent as allowed by contrast bolus timing.

Review of the MIP images confirms the above findings
IMPRESSION: No acute intracranial abnormality.

No large vessel occlusion, hemodynamically significant stenosis, or
evidence of dissection.

## 2024-07-17 ENCOUNTER — Other Ambulatory Visit (HOSPITAL_COMMUNITY): Payer: Self-pay
